# Patient Record
Sex: Female | Born: 1970 | Race: Black or African American | Hispanic: No | Marital: Single | Smoking: Former smoker
Health system: Southern US, Community
[De-identification: ages and names within clinical notes are randomized; demographics above are authoritative.]

## PROBLEM LIST (undated history)

## (undated) DIAGNOSIS — Z87442 Personal history of urinary calculi: Secondary | ICD-10-CM

## (undated) DIAGNOSIS — Z789 Other specified health status: Secondary | ICD-10-CM

## (undated) DIAGNOSIS — G935 Compression of brain: Secondary | ICD-10-CM

## (undated) HISTORY — PX: OTHER SURGICAL HISTORY: SHX169

## (undated) HISTORY — DX: Other specified health status: Z78.9

## (undated) HISTORY — PX: BRAIN SURGERY: SHX531

## (undated) HISTORY — DX: Compression of brain: G93.5

---

## 2002-10-19 DIAGNOSIS — G5601 Carpal tunnel syndrome, right upper limb: Secondary | ICD-10-CM

## 2002-10-19 HISTORY — DX: Carpal tunnel syndrome, right upper limb: G56.01

## 2002-10-19 HISTORY — PX: OTHER SURGICAL HISTORY: SHX169

## 2004-07-19 ENCOUNTER — Ambulatory Visit: Payer: Self-pay | Admitting: Obstetrics and Gynecology

## 2004-08-02 ENCOUNTER — Observation Stay: Payer: Self-pay

## 2004-08-08 ENCOUNTER — Observation Stay: Payer: Self-pay

## 2004-08-11 ENCOUNTER — Observation Stay: Payer: Self-pay | Admitting: Obstetrics and Gynecology

## 2004-08-26 ENCOUNTER — Ambulatory Visit: Payer: Self-pay | Admitting: Obstetrics and Gynecology

## 2004-08-30 ENCOUNTER — Observation Stay: Payer: Self-pay | Admitting: Obstetrics and Gynecology

## 2004-09-05 ENCOUNTER — Observation Stay: Payer: Self-pay

## 2004-09-15 ENCOUNTER — Observation Stay: Payer: Self-pay | Admitting: Obstetrics and Gynecology

## 2004-09-22 ENCOUNTER — Inpatient Hospital Stay: Payer: Self-pay | Admitting: Obstetrics and Gynecology

## 2004-09-25 ENCOUNTER — Ambulatory Visit: Payer: Self-pay | Admitting: Obstetrics and Gynecology

## 2004-10-28 ENCOUNTER — Ambulatory Visit: Payer: Self-pay | Admitting: Obstetrics and Gynecology

## 2004-11-19 ENCOUNTER — Ambulatory Visit: Payer: Self-pay | Admitting: Obstetrics and Gynecology

## 2004-12-23 ENCOUNTER — Emergency Department: Payer: Self-pay | Admitting: Unknown Physician Specialty

## 2005-04-03 ENCOUNTER — Emergency Department: Payer: Self-pay | Admitting: Unknown Physician Specialty

## 2008-03-26 ENCOUNTER — Emergency Department: Payer: Self-pay | Admitting: Emergency Medicine

## 2011-08-19 ENCOUNTER — Ambulatory Visit: Payer: Self-pay | Admitting: Obstetrics and Gynecology

## 2011-12-30 ENCOUNTER — Ambulatory Visit: Payer: Self-pay | Admitting: Internal Medicine

## 2013-03-21 ENCOUNTER — Ambulatory Visit: Payer: Self-pay | Admitting: Obstetrics and Gynecology

## 2013-03-23 HISTORY — PX: COLPOSCOPY: SHX161

## 2014-03-22 ENCOUNTER — Ambulatory Visit: Payer: Self-pay | Admitting: Internal Medicine

## 2015-06-17 ENCOUNTER — Other Ambulatory Visit: Payer: Self-pay | Admitting: Internal Medicine

## 2015-06-17 ENCOUNTER — Other Ambulatory Visit: Payer: Self-pay | Admitting: Obstetrics and Gynecology

## 2015-06-17 DIAGNOSIS — Z1231 Encounter for screening mammogram for malignant neoplasm of breast: Secondary | ICD-10-CM

## 2015-06-21 ENCOUNTER — Ambulatory Visit: Payer: Self-pay

## 2015-06-21 ENCOUNTER — Ambulatory Visit
Admission: RE | Admit: 2015-06-21 | Discharge: 2015-06-21 | Disposition: A | Discharge status: Home / Self Care | Payer: 59 | Source: Ambulatory Visit | Attending: Internal Medicine | Admitting: Internal Medicine

## 2015-06-21 DIAGNOSIS — Z1231 Encounter for screening mammogram for malignant neoplasm of breast: Secondary | ICD-10-CM | POA: Diagnosis not present

## 2016-01-19 ENCOUNTER — Emergency Department: Payer: Self-pay

## 2016-01-19 ENCOUNTER — Encounter: Payer: Self-pay | Admitting: Emergency Medicine

## 2016-01-19 ENCOUNTER — Emergency Department
Admission: EM | Admit: 2016-01-19 | Discharge: 2016-01-19 | Disposition: A | Discharge status: Home / Self Care | Payer: Self-pay | Attending: Student | Admitting: Student

## 2016-01-19 DIAGNOSIS — S91332A Puncture wound without foreign body, left foot, initial encounter: Secondary | ICD-10-CM | POA: Insufficient documentation

## 2016-01-19 DIAGNOSIS — W268XXA Contact with other sharp object(s), not elsewhere classified, initial encounter: Secondary | ICD-10-CM | POA: Insufficient documentation

## 2016-01-19 DIAGNOSIS — F17200 Nicotine dependence, unspecified, uncomplicated: Secondary | ICD-10-CM | POA: Insufficient documentation

## 2016-01-19 DIAGNOSIS — Z23 Encounter for immunization: Secondary | ICD-10-CM | POA: Insufficient documentation

## 2016-01-19 DIAGNOSIS — Y999 Unspecified external cause status: Secondary | ICD-10-CM | POA: Insufficient documentation

## 2016-01-19 DIAGNOSIS — F17210 Nicotine dependence, cigarettes, uncomplicated: Secondary | ICD-10-CM | POA: Insufficient documentation

## 2016-01-19 DIAGNOSIS — Y929 Unspecified place or not applicable: Secondary | ICD-10-CM | POA: Insufficient documentation

## 2016-01-19 DIAGNOSIS — Y939 Activity, unspecified: Secondary | ICD-10-CM | POA: Insufficient documentation

## 2016-01-19 MED ORDER — NAPROXEN 500 MG PO TABS: 500.0000 mg | ORAL_TABLET | Freq: Two times a day (BID) | ORAL | Status: DC

## 2016-01-19 MED ORDER — TETANUS-DIPHTH-ACELL PERTUSSIS 5-2.5-18.5 LF-MCG/0.5 IM SUSP
0.5000 mL | Freq: Once | INTRAMUSCULAR | Status: AC
  Administered 2016-01-19: 0.5 mL via INTRAMUSCULAR
  Filled 2016-01-19: qty 0.5

## 2016-01-19 MED ORDER — AMOXICILLIN-POT CLAVULANATE 875-125 MG PO TABS: 1.0000 | ORAL_TABLET | Freq: Two times a day (BID) | ORAL | Status: DC

## 2016-01-19 NOTE — ED Notes (Signed)
NAD noted at time of D/C. Pt denies questions or concerns. Pt ambulatory to the lobby at this time.  

## 2016-01-19 NOTE — Discharge Instructions (Signed)
Puncture Wound A puncture wound is an injury that is caused by a sharp, thin object that goes through (penetrates) your skin. Usually, a puncture wound does not leave a large opening in your skin, so it may not bleed a lot. However, when you get a puncture wound, dirt or other materials (foreign bodies) can be forced into your wound and break off inside. This increases the chance of infection, such as tetanus. CAUSES Puncture wounds are caused by any sharp, thin object that goes through your skin, such as:  Animal teeth, as with an animal bite.  Sharp, pointed objects, such as nails, splinters of glass, fishhooks, and needles. SYMPTOMS Symptoms of a puncture wound include:  Pain.  Bleeding.  Swelling.  Bruising.  Fluid leaking from the wound.  Numbness, tingling, or loss of function. DIAGNOSIS This condition is diagnosed with a medical history and physical exam. Your wound will be checked to see if it contains any foreign bodies. You may also have X-rays or other imaging tests. TREATMENT Treatment for a puncture wound depends on how serious the wound is. It also depends on whether the wound contains any foreign bodies. Treatment for all types of puncture wounds usually starts with:  Controlling the bleeding.  Washing out the wound with a germ-free (sterile) salt-water solution.  Checking the wound for foreign bodies. Treatment may also include:  Having the wound opened surgically to remove a foreign object.  Closing the wound with stitches (sutures) if it continues to bleed.  Covering the wound with antibiotic ointments and a bandage (dressing).  Receiving a tetanus shot.  Receiving a rabies vaccine. HOME CARE INSTRUCTIONS Medicines  Take or apply over-the-counter and prescription medicines only as told by your health care provider.  If you were prescribed an antibiotic, take or apply it as told by your health care provider. Do not stop using the antibiotic even if  your condition improves. Wound Care  There are many ways to close and cover a wound. For example, a wound can be covered with sutures, skin glue, or adhesive strips. Follow instructions from your health care provider about:  How to take care of your wound.  When and how you should change your dressing.  When you should remove your dressing.  Removing whatever was used to close your wound.  Keep the dressing dry as told by your health care provider. Do not take baths, swim, use a hot tub, or do anything that would put your wound underwater until your health care provider approves.  Clean the wound as told by your health care provider.  Do not scratch or pick at the wound.  Check your wound every day for signs of infection. Watch for:  Redness, swelling, or pain.  Fluid, blood, or pus. General Instructions  Raise (elevate) the injured area above the level of your heart while you are sitting or lying down.  If your puncture wound is in your foot, ask your health care provider if you need to avoid putting weight on your foot and for how long.  Keep all follow-up visits as told by your health care provider. This is important. SEEK MEDICAL CARE IF:  You received a tetanus shot and you have swelling, severe pain, redness, or bleeding at the injection site.  You have a fever.  Your sutures come out.  You notice a bad smell coming from your wound or your dressing.  You notice something coming out of your wound, such as wood or glass.  Your   pain is not controlled with medicine.  You have increased redness, swelling, or pain at the site of your wound.  You have fluid, blood, or pus coming from your wound.  You notice a change in the color of your skin near your wound.  You need to change the dressing frequently due to fluid, blood, or pus draining from your wound.  You develop a new rash.  You develop numbness around your wound. SEEK IMMEDIATE MEDICAL CARE IF:  You  develop severe swelling around your wound.  Your pain suddenly increases and is severe.  You develop painful skin lumps.  You have a red streak going away from your wound.  The wound is on your hand or foot and you cannot properly move a finger or toe.  The wound is on your hand or foot and you notice that your fingers or toes look pale or bluish.   This information is not intended to replace advice given to you by your health care provider. Make sure you discuss any questions you have with your health care provider.   Document Released: 07/15/2005 Document Revised: 06/26/2015 Document Reviewed: 11/28/2014 Elsevier Interactive Patient Education 2016 [place].   Please soak foot and half warm water, half hydrogen peroxide. Take antibiotic as prescribed. Naproxen as needed for pain. Return to the clinic emergency department for any warmth erythema or drainage or increased pain.

## 2016-01-19 NOTE — ED Provider Notes (Signed)
CSN: [id number]     Arrival date & time 01/19/16  1040 History   First MD Initiated Contact with Patient 01/19/16 1053     Chief Complaint  Patient presents with  . Foot Pain     (Consider location/radiation/quality/duration/timing/severity/associated sxs/prior Treatment) HPI  45 year old female presents to emergency department for evaluation of left foot puncture wound. She stepped on a piece of metal last night. She was wearing flip-flops, the piece of metal did not go through the sole of her shoe. She has a puncture wound along the lateral aspect of the left foot at the base of the fifth metatarsal. Pain is 7 out of 10 with walking. She has not taken any medications for pain. Tetanus is not up-to-date. She describes the pain as sharp.  History reviewed. No pertinent past medical history. Past Surgical History  Procedure Laterality Date  . Carpel tunnel release     History reviewed. No pertinent family history. Social History  Substance Use Topics  . Smoking status: Current Some Day Smoker  . Smokeless tobacco: None  . Alcohol Use: Yes   OB History    No data available     Review of Systems  Constitutional: Negative for fever, chills, activity change and fatigue.  HENT: Negative for congestion, sinus pressure and sore throat.   Eyes: Negative for visual disturbance.  Respiratory: Negative for cough, chest tightness and shortness of breath.   Cardiovascular: Negative for chest pain and leg swelling.  Gastrointestinal: Negative for nausea, vomiting, abdominal pain and diarrhea.  Genitourinary: Negative for dysuria.  Musculoskeletal: Negative for arthralgias and gait problem.  Skin: Positive for wound. Negative for rash.  Neurological: Negative for weakness, numbness and headaches.  Hematological: Negative for adenopathy.  Psychiatric/Behavioral: Negative for behavioral problems, confusion and agitation.      Allergies  Review of patient's allergies indicates no known  allergies.  Home Medications   Prior to Admission medications   Medication Sig Start Date End Date Taking? Authorizing Provider  amoxicillin-clavulanate (AUGMENTIN) 875-125 MG tablet Take 1 tablet by mouth every 12 (twelve) hours. 7 days 01/19/16   Duanne Guess, PA-C  naproxen (NAPROSYN) 500 MG tablet Take 1 tablet (500 mg total) by mouth 2 (two) times daily with a meal. 01/19/16   Duanne Guess, PA-C   BP 118/73 mmHg  Pulse 83  Temp(Src) 98.7 F (37.1 C) (Oral)  Resp 18  Ht 5\' 5"  (1.651 m)  Wt 86.183 kg  BMI 31.62 kg/m2  SpO2 99% Physical Exam  Constitutional: She is oriented to person, place, and time. She appears well-developed and well-nourished. No distress.  HENT:  Head: Normocephalic and atraumatic.  Mouth/Throat: Oropharynx is clear and moist.  Eyes: EOM are normal. Pupils are equal, round, and reactive to light. Right eye exhibits no discharge. Left eye exhibits no discharge.  Neck: Normal range of motion. Neck supple.  Cardiovascular: Normal rate, regular rhythm and intact distal pulses.   Pulmonary/Chest: Effort normal and breath sounds normal. No respiratory distress. She exhibits no tenderness.  Musculoskeletal: Normal range of motion. She exhibits no edema.  Examination of the left ankle and foot shows patient is tender to palpation only at the base of the fifth metatarsal where there is a small puncture wound with blood blister present. There is no sign of foreign body. There is no active bleeding. Wound margins are well aligned. She has full ankle plantarflexion and dorsiflexion. She is nontender along the medial or lateral malleolus. 2+ dorsalis pedis pulse. No swelling  warmth or edema.  Neurological: She is alert and oriented to person, place, and time. She has normal reflexes.  Skin: Skin is warm and dry.  Psychiatric: She has a normal mood and affect. Her behavior is normal. Thought content normal.    ED Course  Procedures (including critical care time) Labs  Review Labs Reviewed - No data to display  Imaging Review Dg Foot Complete Left  01/19/2016  CLINICAL DATA:  Stepped on a piece of metal last night with LEFT foot, entrance wound at plantar aspect base of fifth metatarsal question retained foreign body, initial encounter EXAM: LEFT FOOT - COMPLETE 3+ VIEW COMPARISON:  None FINDINGS: Osseous mineralization normal. Joint spaces preserved. No acute fracture, dislocation, or bone destruction. No radiopaque foreign body or soft tissue gas identified. IMPRESSION: Normal exam. Electronically Signed   By: Lavonia Dana M.D.   On: 01/19/2016 11:26   I have personally reviewed and evaluated these images and lab results as part of my medical decision-making.   EKG Interpretation None      MDM   Final diagnoses:  Puncture wound of foot, left, initial encounter    45 year old female with puncture wound to the left foot, did not puncture the sole of her shoe. She will soak daily and half water half peroxide. Take Augmentin twice a day 7 days. Tetanus is updated in the emergency department. Educated on signs and symptoms to return to the ED for. Naproxen as needed for pain.    Duanne Guess, PA-C 01/19/16 1150  Joanne Gavel, MD 01/19/16 [phone]

## 2016-01-19 NOTE — ED Notes (Signed)
Pt stepped on piece of metal last night with left foot.  Thinks piece may still be in foot. Ambulatory to triage.  C/o pain to left foot.

## 2016-03-02 ENCOUNTER — Encounter: Payer: Self-pay | Admitting: Emergency Medicine

## 2016-03-02 ENCOUNTER — Emergency Department
Admission: EM | Admit: 2016-03-02 | Discharge: 2016-03-02 | Disposition: A | Discharge status: Home / Self Care | Payer: 59 | Attending: Emergency Medicine | Admitting: Emergency Medicine

## 2016-03-02 DIAGNOSIS — B029 Zoster without complications: Secondary | ICD-10-CM

## 2016-03-02 DIAGNOSIS — F17200 Nicotine dependence, unspecified, uncomplicated: Secondary | ICD-10-CM | POA: Insufficient documentation

## 2016-03-02 LAB — URINALYSIS COMPLETE WITH MICROSCOPIC (ARMC ONLY)
Bilirubin Urine: NEGATIVE
GLUCOSE, UA: NEGATIVE mg/dL
Hgb urine dipstick: NEGATIVE
Ketones, ur: NEGATIVE mg/dL
Nitrite: NEGATIVE
Protein, ur: NEGATIVE mg/dL
Specific Gravity, Urine: 1.012 (ref 1.005–1.030)
pH: 5 (ref 5.0–8.0)

## 2016-03-02 LAB — CBC
HCT: 39.7 % (ref 35.0–47.0)
Hemoglobin: 13.8 g/dL (ref 12.0–16.0)
MCH: 32.2 pg (ref 26.0–34.0)
MCHC: 34.7 g/dL (ref 32.0–36.0)
MCV: 92.7 fL (ref 80.0–100.0)
PLATELETS: 281 10*3/uL (ref 150–440)
RBC: 4.28 MIL/uL (ref 3.80–5.20)
RDW: 13.7 % (ref 11.5–14.5)
WBC: 5 10*3/uL (ref 3.6–11.0)

## 2016-03-02 LAB — LIPASE, BLOOD: LIPASE: 28 U/L (ref 11–51)

## 2016-03-02 LAB — COMPREHENSIVE METABOLIC PANEL
ALT: 14 U/L (ref 14–54)
ANION GAP: 6 (ref 5–15)
AST: 16 U/L (ref 15–41)
Albumin: 4.2 g/dL (ref 3.5–5.0)
Alkaline Phosphatase: 29 U/L — ABNORMAL LOW (ref 38–126)
BUN: 15 mg/dL (ref 6–20)
CHLORIDE: 107 mmol/L (ref 101–111)
CO2: 26 mmol/L (ref 22–32)
Calcium: 8.7 mg/dL — ABNORMAL LOW (ref 8.9–10.3)
Creatinine, Ser: 0.71 mg/dL (ref 0.44–1.00)
GFR calc non Af Amer: >60 mL/min (ref >60)
Glucose, Bld: 90 mg/dL (ref 65–99)
POTASSIUM: 4 mmol/L (ref 3.5–5.1)
SODIUM: 139 mmol/L (ref 135–145)
Total Bilirubin: 1.4 mg/dL — ABNORMAL HIGH (ref 0.3–1.2)
Total Protein: 7.2 g/dL (ref 6.5–8.1)

## 2016-03-02 LAB — POCT PREGNANCY, URINE: PREG TEST UR: NEGATIVE

## 2016-03-02 MED ORDER — PREDNISONE 10 MG (21) PO TBPK: ORAL_TABLET | ORAL | Status: DC

## 2016-03-02 MED ORDER — TRAMADOL HCL 50 MG PO TABS: 50.0000 mg | ORAL_TABLET | Freq: Four times a day (QID) | ORAL | Status: AC | PRN

## 2016-03-02 MED ORDER — VALACYCLOVIR HCL 1 G PO TABS: 1000.0000 mg | ORAL_TABLET | Freq: Two times a day (BID) | ORAL | Status: DC

## 2016-03-02 NOTE — ED Notes (Addendum)
SAYS RASH ON RIGHT LOWER GROIN.  BURNING AND SOMETIMES ITCHING.  SAYS IT GOES AROUND TO RIGHT LOW BACK. PAINFUL.  SAYS RIGHT L0OWER QUAD PAIN.

## 2016-03-02 NOTE — ED Provider Notes (Addendum)
[facility] Emergency Department Provider Note        Time seen: ----------------------------------------- 11:23 AM on 03/02/2016 -----------------------------------------    I have reviewed the triage vital signs and the nursing notes.   HISTORY  Chief Complaint Rash    HPI Dominique Estes is a 45 y.o. female who presents to ER with a rash to her right groin area. Patient said burning and sometimes itching. She states the burning started around Friday and then subsequently developed a rash. Patient states the pain goes around to her right lower back. She denies history of this before, nothing makes it better or worse. She denies any recent illness.   History reviewed. No pertinent past medical history.  There are no active problems to display for this patient.   Past Surgical History  Procedure Laterality Date  . Carpel tunnel release      Allergies Review of patient's allergies indicates no known allergies.  Social History Social History  Substance Use Topics  . Smoking status: Current Some Day Smoker  . Smokeless tobacco: None  . Alcohol Use: Yes    Review of Systems Constitutional: Negative for fever. Eyes: Negative for visual changes. ENT: Negative for sore throat. Cardiovascular: Negative for chest pain. Respiratory: Negative for shortness of breath. Gastrointestinal: Negative for abdominal pain, vomiting and diarrhea. Genitourinary: Negative for dysuria. Musculoskeletal: Positive for right-sided low back pain Skin:Positive for rash Neurological: Negative for headaches, focal weakness or numbness.  10-point ROS otherwise negative.  ____________________________________________   PHYSICAL EXAM:  VITAL SIGNS: ED Triage Vitals  Enc Vitals Group     BP 03/02/16 0943 119/76 mmHg     Pulse Rate 03/02/16 0943 76     Resp --      Temp 03/02/16 0943 98.6 F (37 C)     Temp src --      SpO2 03/02/16 0943 97 %     Weight --       Height --      Head Cir --      Peak Flow --      Pain Score 03/02/16 0944 9     Pain Loc --      Pain Edu? --      Excl. in [place]? --     Constitutional: Alert and oriented. Well appearing and in no distress. Eyes: Conjunctivae are normal. PERRL. Normal extraocular movements. Musculoskeletal: Nontender with normal range of motion in all extremities. Small right inguinal lymphadenopathy Neurologic:  Normal speech and language. No gross focal neurologic deficits are appreciated.  Skin:  Group vesicular rash noted in the right inguinal area, L1 distribution Psychiatric: Mood and affect are normal. Speech and behavior are normal.   ____________________________________________  ED COURSE:  Pertinent labs & imaging results that were available during my care of the patient were reviewed by me and considered in my medical decision making (see chart for details). Patient presents clinically with shingles. Labs were ordered prior to my evaluation which we will reassess. ____________________________________________    LABS (pertinent positives/negatives)  Labs Reviewed  COMPREHENSIVE METABOLIC PANEL - Abnormal; Notable for the following:    Calcium 8.7 (*)    Alkaline Phosphatase 29 (*)    Total Bilirubin 1.4 (*)    All other components within normal limits  URINALYSIS COMPLETEWITH MICROSCOPIC ([facility] ONLY) - Abnormal; Notable for the following:    Color, Urine YELLOW (*)    APPearance CLOUDY (*)    Leukocytes, UA TRACE (*)    Bacteria,  UA RARE (*)    Squamous Epithelial / LPF TOO NUMEROUS TO COUNT (*)    All other components within normal limits  LIPASE, BLOOD  CBC  POC URINE PREG, ED  POCT PREGNANCY, URINE   ____________________________________________  FINAL ASSESSMENT AND PLAN  Shingles  Plan: Patient with labs as dictated above. Patient resents for shingles, will be started on antiviral medicines and a steroid taper. She is stable for outpatient follow-up with her  doctor   Earleen Newport, MD   Note: This dictation was prepared with Dragon dictation. Any transcriptional errors that result from this process are unintentional   Earleen Newport, MD 03/02/16 [place], MD 03/02/16 1131

## 2016-03-02 NOTE — Discharge Instructions (Signed)

## 2016-03-02 NOTE — ED Notes (Signed)
EDP at bedside  

## 2018-05-18 ENCOUNTER — Emergency Department
Admission: EM | Admit: 2018-05-18 | Discharge: 2018-05-18 | Disposition: A | Discharge status: Home / Self Care | Payer: 59 | Attending: Emergency Medicine | Admitting: Emergency Medicine

## 2018-05-18 ENCOUNTER — Other Ambulatory Visit: Payer: Self-pay

## 2018-05-18 ENCOUNTER — Emergency Department: Payer: Self-pay

## 2018-05-18 DIAGNOSIS — Z79899 Other long term (current) drug therapy: Secondary | ICD-10-CM | POA: Insufficient documentation

## 2018-05-18 DIAGNOSIS — F17210 Nicotine dependence, cigarettes, uncomplicated: Secondary | ICD-10-CM | POA: Insufficient documentation

## 2018-05-18 DIAGNOSIS — G510 Bell's palsy: Secondary | ICD-10-CM | POA: Insufficient documentation

## 2018-05-18 LAB — COMPREHENSIVE METABOLIC PANEL
ALT: 18 U/L (ref 0–44)
ANION GAP: 8 (ref 5–15)
AST: 22 U/L (ref 15–41)
Albumin: 4 g/dL (ref 3.5–5.0)
Alkaline Phosphatase: 33 U/L — ABNORMAL LOW (ref 38–126)
BUN: 9 mg/dL (ref 6–20)
CALCIUM: 8.7 mg/dL — AB (ref 8.9–10.3)
CHLORIDE: 107 mmol/L (ref 98–111)
CO2: 27 mmol/L (ref 22–32)
CREATININE: 0.73 mg/dL (ref 0.44–1.00)
GFR calc Af Amer: >60 mL/min (ref >60)
Glucose, Bld: 97 mg/dL (ref 70–99)
Potassium: 3.7 mmol/L (ref 3.5–5.1)
SODIUM: 142 mmol/L (ref 135–145)
Total Bilirubin: 1.2 mg/dL (ref 0.3–1.2)
Total Protein: 7.1 g/dL (ref 6.5–8.1)

## 2018-05-18 LAB — DIFFERENTIAL
BASOS PCT: 1 %
Basophils Absolute: 0.1 10*3/uL (ref 0–0.1)
EOS ABS: 0.2 10*3/uL (ref 0–0.7)
EOS PCT: 3 %
Lymphocytes Relative: 47 %
Lymphs Abs: 2.3 10*3/uL (ref 1.0–3.6)
MONO ABS: 0.5 10*3/uL (ref 0.2–0.9)
Monocytes Relative: 11 %
Neutro Abs: 1.8 10*3/uL (ref 1.4–6.5)
Neutrophils Relative %: 38 %

## 2018-05-18 LAB — CBC
HCT: 38.5 % (ref 35.0–47.0)
Hemoglobin: 13.2 g/dL (ref 12.0–16.0)
MCH: 32.2 pg (ref 26.0–34.0)
MCHC: 34.4 g/dL (ref 32.0–36.0)
MCV: 93.5 fL (ref 80.0–100.0)
PLATELETS: 340 10*3/uL (ref 150–440)
RBC: 4.12 MIL/uL (ref 3.80–5.20)
RDW: 12.7 % (ref 11.5–14.5)
WBC: 4.9 10*3/uL (ref 3.6–11.0)

## 2018-05-18 LAB — GLUCOSE, CAPILLARY: GLUCOSE-CAPILLARY: 108 mg/dL — AB (ref 70–99)

## 2018-05-18 LAB — TROPONIN I: Troponin I: <0.03 ng/mL (ref <0.03)

## 2018-05-18 LAB — APTT: aPTT: 27 seconds (ref 24–36)

## 2018-05-18 LAB — PROTIME-INR
INR: 1.01
Prothrombin Time: 13.2 seconds (ref 11.4–15.2)

## 2018-05-18 MED ORDER — ARTIFICIAL TEARS OPHTHALMIC OINT: TOPICAL_OINTMENT | OPHTHALMIC | 1 refills | Status: DC | PRN

## 2018-05-18 MED ORDER — PREDNISONE 10 MG PO TABS: 60.0000 mg | ORAL_TABLET | Freq: Every day | ORAL | 0 refills | Status: DC

## 2018-05-18 MED ORDER — VALACYCLOVIR HCL 500 MG PO TABS: 1000.0000 mg | ORAL_TABLET | Freq: Every day | ORAL | 0 refills | Status: AC

## 2018-05-18 NOTE — ED Provider Notes (Signed)
[facility] Emergency Department Provider Note  ____________________________________________   I have reviewed the triage vital signs and the nursing notes. Where available I have reviewed prior notes and, if possible and indicated, outside hospital notes.    HISTORY  Chief Complaint Numbness    HPI Dominique Estes is a 47 y.o. female who is healthy, no recent history of rashes or viral syndrome, presents today with about 24 hours of left-sided facial tingling and weakness.  It does involve the forehead.  Never had this before.  No recent tick bite.  [place] no other neurologic complaints.  Nothing makes it better nothing makes it worse no antecedent treatment.   History reviewed. No pertinent past medical history.  There are no active problems to display for this patient.   Past Surgical History:  Procedure Laterality Date  . carpel tunnel release      Prior to Admission medications   Medication Sig Start Date End Date Taking? Authorizing Provider  amoxicillin-clavulanate (AUGMENTIN) 875-125 MG tablet Take 1 tablet by mouth every 12 (twelve) hours. 7 days 01/19/16   Duanne Guess, PA-C  naproxen (NAPROSYN) 500 MG tablet Take 1 tablet (500 mg total) by mouth 2 (two) times daily with a meal. 01/19/16   Duanne Guess, PA-C  predniSONE (STERAPRED UNI-PAK 21 TAB) 10 MG (21) TBPK tablet Dispense steroid taper pack as directed 03/02/16   Earleen Newport, MD  valACYclovir (VALTREX) 1000 MG tablet Take 1 tablet (1,000 mg total) by mouth 2 (two) times daily. 03/02/16   Earleen Newport, MD    Allergies Patient has no known allergies.  History reviewed. No pertinent family history.  Social History Social History   Tobacco Use  . Smoking status: Current Some Day Smoker  Substance Use Topics  . Alcohol use: Yes  . Drug use: No    Review of Systems Constitutional: No fever/chills Eyes: No visual changes. ENT: No sore throat. No stiff neck no  neck pain Cardiovascular: Denies chest pain. Respiratory: Denies shortness of breath. Gastrointestinal:   no vomiting.  No diarrhea.  No constipation. Genitourinary: Negative for dysuria. Musculoskeletal: Negative lower extremity swelling Skin: Negative for rash. Neurological: Negative for severe headaches, see HPI  ____________________________________________   PHYSICAL EXAM:  VITAL SIGNS: ED Triage Vitals  Enc Vitals Group     BP 05/18/18 1453 121/82     Pulse Rate 05/18/18 1453 78     Resp 05/18/18 1453 16     Temp 05/18/18 1453 98.5 F (36.9 C)     Temp Source 05/18/18 1453 Oral     SpO2 05/18/18 1453 100 %     Weight 05/18/18 1445 240 lb (108.9 kg)     Height 05/18/18 1445 5\' 5"  (1.651 m)     Head Circumference --      Peak Flow --      Pain Score 05/18/18 1445 5     Pain Loc --      Pain Edu? --      Excl. in [place]? --     Constitutional: Alert and oriented. Well appearing and in no acute distress. Eyes: Conjunctivae are normal Head: Atraumatic HEENT: No congestion/rhinnorhea. Mucous membranes are moist.  Oropharynx non-erythematous, no oral lesions, no herpetic lesions seen in the ear or nose, or anywhere else for that matter, normal TMs, Neck:   Nontender with no meningismus, no masses, no stridor Cardiovascular: Normal rate, regular rhythm. Grossly normal heart sounds.  Good peripheral circulation. Respiratory: Normal respiratory  effort.  No retractions. Lungs CTAB. Abdominal: Soft and nontender. No distention. No guarding no rebound Back:  There is no focal tenderness or step off.  there is no midline tenderness there are no lesions noted. Musculoskeletal: No lower extremity tenderness, no upper extremity tenderness. No joint effusions, no DVT signs strong distal pulses no edema Neurologic: \cranial nerves II through XII are grossly intact, with the exception of the seventh over the left, which involves his forehead.  There is forehead involved decreased strength on  that side and subjective tingling although she can feel he feels less significant.  There is also the ability to close the eye nearly completely  5 out of 5 strength bilateral upper and lower extremity. Finger to nose within normal limits heel to shin within normal limits, speech is normal with no word finding difficulty or dysarthria, reflexes symmetric, pupils are equally round and reactive to light, there is no pronator drift, sensation is normal, vision is intact to confrontation, gait is deferred, there is no nystagmus, normal neurologic exam Skin:  Skin is warm, dry and intact. No rash noted. Psychiatric: Mood and affect are normal. Speech and behavior are normal.  ____________________________________________   LABS (all labs ordered are listed, but only abnormal results are displayed)  Labs Reviewed  COMPREHENSIVE METABOLIC PANEL - Abnormal; Notable for the following components:      Result Value   Calcium 8.7 (*)    Alkaline Phosphatase 33 (*)    All other components within normal limits  GLUCOSE, CAPILLARY - Abnormal; Notable for the following components:   Glucose-Capillary 108 (*)    All other components within normal limits  PROTIME-INR  APTT  CBC  DIFFERENTIAL  TROPONIN I  CBG MONITORING, ED    Pertinent labs  results that were available during my care of the patient were reviewed by me and considered in my medical decision making (see chart for details). ____________________________________________  EKG  I personally interpreted any EKGs ordered by me or triage Normal sinus rhythm rate 70 bpm, no acute ST elevation or depression normal axis unremarkable EKG borderline right bundle blanch block ____________________________________________  RADIOLOGY  Pertinent labs & imaging results that were available during my care of the patient were reviewed by me and considered in my medical decision making (see chart for details). If possible, patient and/or family made aware  of any abnormal findings.  Ct Head Wo Contrast  Result Date: 05/18/2018 CLINICAL DATA:  Left-sided facial numbness. EXAM: CT HEAD WITHOUT CONTRAST TECHNIQUE: Contiguous axial images were obtained from the base of the skull through the vertex without intravenous contrast. COMPARISON:  12/30/2011 FINDINGS: Brain: There is no evidence for acute hemorrhage, hydrocephalus, mass lesion, or abnormal extra-axial fluid collection. No definite CT evidence for acute infarction. Vascular: No hyperdense vessel or unexpected calcification. Skull: Tiny intradural calcification left middle cranial fossa is unchanged in the 6 year interval since prior study. No evidence for fracture. No worrisome lytic or sclerotic lesion. Sinuses/Orbits: The visualized paranasal sinuses and mastoid air cells are clear. Visualized portions of the globes and intraorbital fat are unremarkable. Other: None. IMPRESSION: 1. Unremarkable CT evaluation of the brain with no findings to explain the patient's history of left facial numbness. Electronically Signed   By: Misty Stanley M.D.   On: 05/18/2018 16:23   ____________________________________________    PROCEDURES  Procedure(s) performed: None  Procedures  Critical Care performed: None  ____________________________________________   INITIAL IMPRESSION / ASSESSMENT AND PLAN / ED COURSE  Pertinent labs &  imaging results that were available during my care of the patient were reviewed by me and considered in my medical decision making (see chart for details).  Patient with signs and symptoms of acute Bell's palsy, she is within 24 hours of onset we will start her on steroids, we will also give her acyclovir as a precaution although they did that are equivocal about it I will also give her artificial tears have given her instructions on how to protect her eye and we will discharge with close neurologic follow-up.  Low suspicion for central stroke which sometimes can cause the same  symptoms, this very much seems like a Bell's palsy.    ____________________________________________   FINAL CLINICAL IMPRESSION(S) / ED DIAGNOSES  Final diagnoses:  None      This chart was dictated using voice recognition software.  Despite best efforts to proofread,  errors can occur which can change meaning.      Schuyler Amor, MD 05/18/18 [phone]

## 2018-05-18 NOTE — ED Notes (Signed)
Pt alert and oriented X4, active, cooperative, pt in NAD. RR even and unlabored, color WNL.  Pt informed to return if any life threatening symptoms occur.  Discharge and followup instructions reviewed. Ambulates safely. 

## 2018-05-18 NOTE — ED Triage Notes (Signed)
Pt arrives with numbness to L side of face since yesterday. Alert, oriented, speaking in complete sentences. No facial droop noted but L sided paralysis appears when pt tries to move L side of face. No arm or leg drift. Denies any other area of body numb. Denies blood thinners.

## 2018-05-18 NOTE — ED Notes (Signed)
Decreased sensation to left side of face that began yesterday, no hx of such. When patient lifts eyebrows, only right brow moves up, left sided facial paralysis with smiling. Speech clear.

## 2018-12-28 ENCOUNTER — Other Ambulatory Visit: Payer: Self-pay | Admitting: Physician Assistant

## 2018-12-28 DIAGNOSIS — Z1231 Encounter for screening mammogram for malignant neoplasm of breast: Secondary | ICD-10-CM

## 2019-01-04 ENCOUNTER — Other Ambulatory Visit: Payer: Self-pay

## 2019-01-04 ENCOUNTER — Ambulatory Visit
Admission: RE | Admit: 2019-01-04 | Discharge: 2019-01-04 | Disposition: A | Discharge status: Home / Self Care | Payer: Managed Care, Other (non HMO) | Source: Ambulatory Visit | Attending: Physician Assistant | Admitting: Physician Assistant

## 2019-01-04 DIAGNOSIS — Z1231 Encounter for screening mammogram for malignant neoplasm of breast: Secondary | ICD-10-CM | POA: Insufficient documentation

## 2019-08-11 ENCOUNTER — Telehealth: Payer: Self-pay | Admitting: Family Medicine

## 2019-08-11 NOTE — Telephone Encounter (Signed)
pls call me having problems with my IUD

## 2019-08-11 NOTE — Telephone Encounter (Signed)
Returned patient phone call. Patient states she is concerned that she needs her IUD changed because she has recently been having vaginal bleeding and passing clots. Patient is unsure if when last Mirena IUD was placed. RN offered to schedule IUD removal/reinsertion appt. Patient declined stating "I'll call my Ob/Gyn to find out when I had this one put in and call you back on Monday." Hal Morales, RN

## 2019-11-28 ENCOUNTER — Other Ambulatory Visit: Payer: Self-pay | Admitting: Internal Medicine

## 2019-11-28 DIAGNOSIS — Z1231 Encounter for screening mammogram for malignant neoplasm of breast: Secondary | ICD-10-CM

## 2019-12-26 ENCOUNTER — Telehealth: Payer: Self-pay | Admitting: Family Medicine

## 2020-01-05 ENCOUNTER — Ambulatory Visit
Admission: RE | Admit: 2020-01-05 | Discharge: 2020-01-05 | Disposition: A | Discharge status: Home / Self Care | Payer: Managed Care, Other (non HMO) | Source: Ambulatory Visit | Attending: Internal Medicine | Admitting: Internal Medicine

## 2020-01-05 DIAGNOSIS — Z1231 Encounter for screening mammogram for malignant neoplasm of breast: Secondary | ICD-10-CM | POA: Insufficient documentation

## 2020-01-09 ENCOUNTER — Other Ambulatory Visit: Payer: Self-pay | Admitting: Physician Assistant

## 2020-01-09 DIAGNOSIS — R1032 Left lower quadrant pain: Secondary | ICD-10-CM

## 2020-01-10 ENCOUNTER — Ambulatory Visit
Admission: RE | Admit: 2020-01-10 | Discharge: 2020-01-10 | Disposition: A | Discharge status: Home / Self Care | Payer: Managed Care, Other (non HMO) | Source: Ambulatory Visit | Attending: Physician Assistant | Admitting: Physician Assistant

## 2020-01-10 ENCOUNTER — Other Ambulatory Visit: Payer: Self-pay

## 2020-01-10 DIAGNOSIS — R1032 Left lower quadrant pain: Secondary | ICD-10-CM | POA: Insufficient documentation

## 2020-01-10 MED ORDER — IOHEXOL 300 MG/ML  SOLN
100.0000 mL | Freq: Once | INTRAMUSCULAR | Status: AC | PRN
  Administered 2020-01-10: 100 mL via INTRAVENOUS

## 2020-02-01 NOTE — Telephone Encounter (Signed)
TC not routed to RN for triage when taken in March.  Attempted TC to patient, somone answered the phone but wouldn't say anything. Aileen Fass, RN

## 2020-09-24 ENCOUNTER — Encounter: Payer: Self-pay | Admitting: Family Medicine

## 2020-09-24 ENCOUNTER — Ambulatory Visit (LOCAL_COMMUNITY_HEALTH_CENTER): Payer: Medicaid Other | Admitting: Family Medicine

## 2020-09-24 ENCOUNTER — Other Ambulatory Visit: Payer: Self-pay

## 2020-09-24 VITALS — Ht 65.5 in | Wt 254.2 lb

## 2020-09-24 DIAGNOSIS — Z30430 Encounter for insertion of intrauterine contraceptive device: Secondary | ICD-10-CM

## 2020-09-24 DIAGNOSIS — Z3009 Encounter for other general counseling and advice on contraception: Secondary | ICD-10-CM | POA: Diagnosis not present

## 2020-09-24 DIAGNOSIS — Z30432 Encounter for removal of intrauterine contraceptive device: Secondary | ICD-10-CM

## 2020-09-24 MED ORDER — LEVONORGESTREL 20 MCG/24HR IU IUD
1.0000 | INTRAUTERINE_SYSTEM | Freq: Once | INTRAUTERINE | Status: AC
  Administered 2020-09-24: 1 via INTRAUTERINE

## 2020-09-24 NOTE — Progress Notes (Signed)
Family Planning Visit- Repeat Yearly Visit  Subjective:  Dominique Estes is a 49 y.o. [phone]  being seen today for an well woman visit and to discuss family planning options.    She is currently using IUD Mirena for pregnancy prevention. Patient reports she does not if she or her partner wants a pregnancy in the next year. Patient  does not have a problem list on file.  Chief Complaint  Patient presents with  . Annual Exam  . Contraception    desires IUD removal and reinsertion - Mirena    Patient reports she is here for her IUD removal and insertion.  States that since IUD was inserted 5 years ago she has begun to have heavier clotting.  She had her mammogram 03/2020 that was normal.  She is being folled by her PCp annually and as needed.  Patient denies other concerns  See flowsheet for other program required questions.   Body mass index is 41.66 kg/m. - Patient is eligible for diabetes screening based on BMI and age >88?  yes HA1C ordered? No  Client is followed by her PCP  Patient reports 2 of partners in last year. Desires STI screening?  Yes   Has patient been screened once for HCV in the past?  No  No results found for: HCVAB  Does the patient have current of drug use, have a partner with drug use, and/or has been incarcerated since last result? No  If yes-- Screen for HCV through [facility]   Does the patient meet criteria for HBV testing? No  Criteria:  -Household, sexual or needle sharing contact with HBV -History of drug use -HIV positive -Those with known Hep C   Health Maintenance Due  Topic Date Due  . Hepatitis C Screening  Never done  . HIV Screening  Never done  . PAP SMEAR-Modifier  Never done  . INFLUENZA VACCINE  Never done    Review of Systems  Constitutional: Positive for weight loss.       Intentional weight of about 10 lbs.  All other systems reviewed and are negative.   The following portions of the patient's history were reviewed  and updated as appropriate: allergies, current medications, past family history, past medical history, past social history, past surgical history and problem list. Problem list updated.  Objective:   Vitals:   09/24/20 1330  Weight: 254 lb 3.2 oz (115.3 kg)  Height: 5' 5.5" (1.664 m)    Physical Exam Constitutional:      Appearance: She is obese.  Cardiovascular:     Rate and Rhythm: Normal rate.  Abdominal:     Hernia: There is no hernia in the left inguinal area or right inguinal area.  Genitourinary:    General: Normal vulva.     Exam position: Lithotomy position.     Labia:        Right: No rash, tenderness or lesion.        Left: No rash, tenderness or lesion.      Vagina: Normal. No vaginal discharge, erythema, tenderness, bleeding, lesions or prolapsed vaginal walls.     Cervix: Normal.     Uterus: Normal.      Adnexa: Right adnexa normal and left adnexa normal.     Rectum: Normal.  Musculoskeletal:     Cervical back: Neck supple.  Skin:    General: Skin is warm and dry.  Neurological:     Mental Status: She is alert and oriented to  person, place, and time.    Assessment and Plan:  Dominique Estes is a 49 y.o. female [phone] presenting to the [facility] for an yearly well woman exam/family planning visit  Contraception counseling: Reviewed all forms of birth control options in the tiered based approach. available including abstinence; over the counter/barrier methods; hormonal contraceptive medication including pill, patch, ring, injection,contraceptive implant, ECP; hormonal and nonhormonal IUDs; permanent sterilization options including vasectomy and the various tubal sterilization modalities. Risks, benefits, and typical effectiveness rates were reviewed.  Questions were answered.  Written information was also given to the patient to review.  Patient desires Mirena IUD, this was prescribed for patient. She will follow up in  1 year for  surveillance.  She was told to call with any further questions, or with any concerns about this method of contraception.  Emphasized use of condoms 100% of the time for STI prevention.  Patient was no an ECP candidate   1. Family planning  - Chlamydia/Gonorrhea [facility]  2. Encounter for IUD removal   IUD Removal  Patient identified, informed consent performed, consent signed.  Patient was in the dorsal lithotomy position, normal external genitalia was noted.  A speculum was placed in the patient's vagina, normal discharge was noted, no lesions. The cervix was visualized, no lesions, no abnormal discharge.  The strings of the IUD were grasped and pulled using ring forceps. The IUD was removed in its entirety.  Patient tolerated the procedure well.    Patient will use Mirena for contraception  Routine preventative health maintenance measures emphasized.  3. Encounter for IUD insertion  Patient presented to [facility] for IUD insertion. Her GC/CT screening was found to be up to date and using WHO criteria we can be reasonably certain she is not pregnant   See Flowsheet for IUD check list  IUD Insertion Procedure Note Patient identified, informed consent performed, consent signed.   Discussed risks of irregular bleeding, cramping, infection, malpositioning or misplacement of the IUD outside the uterus which may require further procedure such as laparoscopy. Time out was performed.    Speculum placed in the vagina.  Cervix visualized.  Cleaned with Betadine x 2.  Grasped anteriorly with a single tooth tenaculum.  Uterus sounded to 9 cm.  IUD placed per manufacturer's recommendations.  Strings trimmed to 3 cm. Tenaculum was removed, good hemostasis noted.  Patient tolerated procedure well.   Patient was given post-procedure instructions- both agency handout and verbally by provider.  She was advised to have backup contraception for one week.  Patient was also asked to check IUD strings  periodically or follow up in 4 weeks for IUD check. - levonorgestrel (MIRENA) 20 MCG/24HR IUD 1 each     Return in about 1 year (around 09/24/2021) for 1 month for IUD string check as needed.  No future appointments.  Hassell Done, FNP

## 2020-09-24 NOTE — Progress Notes (Signed)
Pt to clinic for physical and IUD removal and reinsertion. Pt states last pap was done at [facility].

## 2020-09-26 LAB — IGP, APTIMA HPV
HPV Aptima: NEGATIVE
PAP Smear Comment: 0

## 2020-10-29 ENCOUNTER — Telehealth: Payer: Self-pay | Admitting: Family Medicine

## 2020-10-29 NOTE — Telephone Encounter (Signed)
Consulted by RN re: patient situation.  Reviewed RN note and agree that it reflects our discussion and my recommendations.

## 2020-10-29 NOTE — Telephone Encounter (Signed)
Return call to pt at phone number provided. Pt reports that she is having some irregular menstrual bleeding that started 2 months prior to having her Mirena IUD removed and reinserted 09/24/2020. Pt reports she had a period the beginning of 10/2020 and it stopped but then started back up again 10/28/2020 and reports is heavy and having some blood clots that come out. Pt reports going through about 4 pads a day. Pt reports has cramps on and off that Tylenol helped with. Pt denies any further symptoms and not having any fever, dizziness or lightheadedness. Consulted with Antoine Primas, PA who reports pt could try some Ibuprofen 800mg  every 8 hours (three times a day) with food or milk for 5 to 7 days to see if that helps with bleeding but if not to let us know. Counseled pt per Antoine Primas, PA and pt states understanding of counseling and how to take Ibuprofen. Counseled pt that if she does develop any dizziness, lightheadedness, severe cramps, or if she goes through a pad in an hour to go to ER or Urgent care to be evaluated and pt states understanding. Pt states she will try these recommendations and will give [place] a call if symptoms aren't relieved or if she has any further questions or concerns. Pt with no other questions at this time.

## 2021-01-17 ENCOUNTER — Other Ambulatory Visit: Payer: Self-pay | Admitting: Internal Medicine

## 2021-01-17 DIAGNOSIS — Z1231 Encounter for screening mammogram for malignant neoplasm of breast: Secondary | ICD-10-CM

## 2021-01-31 ENCOUNTER — Other Ambulatory Visit: Payer: Self-pay | Admitting: Internal Medicine

## 2021-01-31 DIAGNOSIS — Z1231 Encounter for screening mammogram for malignant neoplasm of breast: Secondary | ICD-10-CM

## 2021-02-05 ENCOUNTER — Ambulatory Visit
Admission: RE | Admit: 2021-02-05 | Discharge: 2021-02-05 | Disposition: A | Discharge status: Home / Self Care | Payer: Self-pay | Source: Ambulatory Visit | Attending: Internal Medicine | Admitting: Internal Medicine

## 2021-02-05 ENCOUNTER — Other Ambulatory Visit: Payer: Self-pay

## 2021-02-05 DIAGNOSIS — Z1231 Encounter for screening mammogram for malignant neoplasm of breast: Secondary | ICD-10-CM | POA: Insufficient documentation

## 2021-04-09 ENCOUNTER — Other Ambulatory Visit: Payer: Self-pay

## 2021-04-09 ENCOUNTER — Ambulatory Visit: Payer: Managed Care, Other (non HMO)

## 2021-04-09 ENCOUNTER — Ambulatory Visit (LOCAL_COMMUNITY_HEALTH_CENTER): Payer: BC Managed Care – PPO | Admitting: Advanced Practice Midwife

## 2021-04-09 ENCOUNTER — Encounter: Payer: Self-pay | Admitting: Advanced Practice Midwife

## 2021-04-09 VITALS — BP 137/85 | Ht 65.0 in | Wt 255.8 lb

## 2021-04-09 DIAGNOSIS — N939 Abnormal uterine and vaginal bleeding, unspecified: Secondary | ICD-10-CM

## 2021-04-09 DIAGNOSIS — F17200 Nicotine dependence, unspecified, uncomplicated: Secondary | ICD-10-CM

## 2021-04-09 DIAGNOSIS — Z3009 Encounter for other general counseling and advice on contraception: Secondary | ICD-10-CM

## 2021-04-09 DIAGNOSIS — Z30431 Encounter for routine checking of intrauterine contraceptive device: Secondary | ICD-10-CM

## 2021-04-09 DIAGNOSIS — E669 Obesity, unspecified: Secondary | ICD-10-CM

## 2021-04-09 NOTE — Progress Notes (Signed)
Contraception/Family Planning VISIT ENCOUNTER NOTE  Subjective:   Dominique Estes is a 50 y.o. SBF smoker [phone] female here for reproductive life counseling.  Reports she does not want a pregnancy in the next year. Denies abnormal vaginal bleeding, discharge, pelvic pain, problems with intercourse or other gynecologic concerns.  Pt c/o bleeding with clots since this Mirena inserted 09/24/20. Has had 3 other Mirena's without any problems. Tried Ibuprofen TID from 09/2020-01/2021 without results. Last PE 01/16/21 at [facility]. Last pap 09/24/20 neg HPV neg. Smokign 1/2-1 ppd. Last MJ 11/2020.  Last ETOH yesterday (1 mixed drink) qoday. Has apt with [facility] on 04/24/21 to discuss getting hysterectomy/ablation   Gynecologic History Patient's last menstrual period was 04/06/2021 (exact date). Contraception: IUD  Health Maintenance Due  Topic Date Due   Pneumococcal Vaccine 65-63 Years old (1 - PCV) Never done   HIV Screening  Never done   Hepatitis C Screening  Never done   COLONOSCOPY (Pts 45-34yrs Insurance coverage will need to be confirmed)  Never done   COVID-19 Vaccine (3 - Booster for Moderna series) 11/30/2020     The following portions of the patient's history were reviewed and updated as appropriate: allergies, current medications, past family history, past medical history, past social history, past surgical history and problem list.  Review of Systems Pertinent items are noted in HPI.   Objective:  BP 137/85   Ht 5\' 5"  (1.651 m)   Wt 255 lb 12.8 oz (116 kg)   LMP 04/06/2021 (Exact Date)   BMI 42.57 kg/m  Gen: well appearing, NAD HEENT: no scleral icterus CV: RR Lung: Normal WOB Ext: warm well perfused   Assessment and Plan:   Contraception counseling: Reviewed all forms of birth control options in the tiered based approach. available including abstinence; over the counter/barrier methods; hormonal contraceptive medication including pill, patch, ring, injection,contraceptive implant,  ECP; hormonal and nonhormonal IUDs; permanent sterilization options including vasectomy and the various tubal sterilization modalities. Risks, benefits, and typical effectiveness rates were reviewed.  Questions were answered.  Written information was also given to the patient to review.  Patient desires hysterectomy, this was not prescribed for patient. She will follow up in  04/24/21 at [facility] for surveillance.  She was told to call with any further questions, or with any concerns about this method of contraception.  Emphasized use of condoms 100% of the time for STI prevention.  Patient was offered ECP. ECP was not accepted by the patient. ECP counseling was not given - see RN documentation  1. Obesity, unspecified classification, unspecified obesity type, unspecified whether serious comorbidity present   2. Family planning Has apt already with [facility] 04/24/21 to discuss hysterectomy/ablation to solve issues  3. Encounter for management of intrauterine contraceptive device (IUD), unspecified IUD management type Mirena in place since 09/24/20  4. Smoker 1/2-1 ppd Counseled via 5 A's to stop smoking    Please refer to After Visit Summary for other counseling recommendations.   No follow-ups on file.  Herbie Saxon, [place]

## 2021-04-09 NOTE — Progress Notes (Signed)
Here today for concerns with heavy vaginal bleeding with clots. Also complaining of pain that radiates down left leg all since having Mirena IUD placed in 09/2020. Last PE was 01/16/2021 at [facility]. Last Pap Smear (NIL) with IUD placement was here 09/24/2020. Hal Morales, RN

## 2021-05-13 ENCOUNTER — Encounter: Payer: Self-pay | Admitting: Advanced Practice Midwife

## 2021-05-14 ENCOUNTER — Other Ambulatory Visit: Payer: Self-pay | Admitting: Obstetrics and Gynecology

## 2021-05-14 ENCOUNTER — Telehealth: Payer: Self-pay

## 2021-05-14 NOTE — Telephone Encounter (Signed)
Consulted on the plan of care for this client.  I agree with the documented note and actions taken to provide care for this client.  Ola Spurr, CNM

## 2021-05-14 NOTE — Telephone Encounter (Addendum)
Phone call to pt. Pt counseled that [facility] provider has reviewed her concerns and [facility] provider recommends seeing her PCP for care. Pt expresses understanding and plans to call PCP for appt.   In response to my chart pt message: May 13, 2021        1:23 PM Eduard Clos, RN routed this conversation to [facility]Std    Sydnee Levans to Herbie Saxon, CNM       11:33 AM I have a question, or should i contact Dr. Ginette Pitman, but I am having weakness here and there, and now having pain in the same spot on each leg in the thigh area but on the outside of my leg, I am just wondering is loosing blood causing this or is this something else, I just know the headaches and now weakness and pain just coming about was not happening before this heavy bleeding. Can you let me know if you can help me understand or should I contact my doctor?  This encounter is not signed. The conversation may still be ongoing.

## 2021-06-09 NOTE — H&P (Addendum)
Dominique Estes is a 50 y.o. female presenting with Pre Op Consulting  History of Present Illness: The patient presents today with abnormal uterine bleeding. She has used IUDs for birth control and bleeding control for years. We discussed options for bleeding control: pill, patch, ring, injection, implant, ablation and hysterectomy. She has requested a hysterectomy for definitive management of her menorrhagia and AUB  Workup: [id number] FOR INTRAEPITHELIAL LESION OR MALIGNANCY.  EMBx: Specimen A-Endometrial Biopsy: SCANT UNDERDEVELOPED  ENDOMETRIAL GLANDS AND DECIDUALIZED STROMA COMPATIBLE WITH  PROGESTIN EFFECT, MIXED WITH DEGENERATED TISSUE. 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  Past Surgical History:  has a past surgical history that includes Endoscopic Carpal Tunnel Release (Right, 06/2003) and Colposcopy (03-23-2013). Family History: family history includes Bipolar disorder in her mother; Diabetes in her brother, maternal aunt, and mother; Glaucoma in her maternal grandmother; High blood pressure (Hypertension) in her brother; No Known Problems in her father; Obesity in her mother. Social History:  reports that she quit smoking about 9 years ago. Her smoking use included cigarettes. She smoked 1.00 pack per day for 0.00 years. She has never used smokeless tobacco. She reports current alcohol use. She reports previous drug use. OB/GYN History:  OB History     Gravida  6   Para  3   Term  3   Preterm  0   AB  3    Living  3      SAB  0   IAB  0   Ectopic  0   Molar  0   Multiple  0   Live Births  0         Allergies: is allergic to chocolate flavor, peanut, and lysteda [tranexamic acid]. Medications: Current Outpatient Medications:    ascorbic acid (VITA-C ORAL), Take by mouth, Disp: , Rfl:    cyanocobalamin (VITAMIN B12) 1000 MCG tablet, Take 1,000 mcg by mouth once daily, Disp: , Rfl:    ergocalciferol, vitamin D2, 1,250 mcg (50,000 unit) capsule, Take 1 capsule (50,000 Units total) by mouth once a week, Disp: 13 capsule, Rfl: 3   iron polysaccharides (FERREX) 150 mg iron capsule, Take 1 capsule (150 mg total) by mouth once daily (Patient not taking: No sig reported), Disp: 30 capsule, Rfl: 5   levonorgestrel (MIRENA) 20 mcg/24 hr (5 years) IUD, Insert 1 each into the uterus once. Follow package directions., Disp: , Rfl:    phentermine (ADIPEX-P) 37.5 mg tablet, Take 1 tablet (37.5 mg total) by mouth every morning before breakfast, Disp: 30 tablet, Rfl: 0   predniSONE (DELTASONE) 10 MG tablet, 4,4,3,3,2,2,1,1, Disp: 20 tablet, Rfl: 0   tranexamic acid (LYSTEDA) 650 mg tablet, Take 2 tablets (1,300 mg total) by mouth 3 (three) times daily Take for a maximum of 5 days during monthly menstruation., Disp: 30 tablet, Rfl: 1  Review of Systems: No SOB, no palpitations or chest pain, no new lower extremity edema, no nausea or vomiting or bowel or bladder complaints. See  HPI for gyn specific ROS.   Exam:   BP 135/84   Pulse 80   Ht 165.1 cm ('5\' 5"'$ )   Wt (!) 116.4 kg (256 lb 9.6 oz)   BMI 42.70 kg/m   General: Patient is well-groomed, well-nourished, appears stated age in no acute distress   HEENT: head is atraumatic and normocephalic, trachea is midline, neck is supple with no palpable nodules   CV: Regular rhythm and normal heart rate, no murmur   Pulm: Clear to auscultation throughout lung fields with no wheezing, crackles, or rhonchi. No increased work of breathing  Abdomen:  soft , no mass, non-tender, no rebound tenderness, no hepatomegaly  Pelvic: tanner stage 5 ,   External genitalia: vulva /labia no lesions  Urethra: no prolapse  Vagina: normal physiologic d/c, laxity in vaginal walls  Cervix: no lesions, no cervical motion tenderness, good descent, +strings visible   Pap collected today   Endometrial biopsy: The cervix was cleaned with betadine, topical Hurriciane spray applied, and a single tooth tenaculum is applied to the anterior cervix. The Pipelle catheter was placed into the endometrial cavity. It sounds to 7 cm and adequate tissue was removed.  Impression:   The primary encounter diagnosis was Pre-op exam. Diagnoses of Excessive or frequent menstruation, Abnormal uterine bleeding (AUB), Uterine leiomyoma, unspecified location, Encounter for cervical Pap smear with pelvic exam, and Cervical cancer screening were also pertinent to this visit.  Plan:   - Abnormal uterine bleeding, Excessive bleeding:   The patient and I discussed the technical aspects of the procedure including the potential for risks and complications.  These include but are not limited to the risk of infection requiring post-operative antibiotics or further procedures.  We talked about the risk of injury to adjacent organs including bladder, bowel, ureter, blood vessels or nerves.  We talked about the need to convert to a laparoscopy or an open incision.  We talked about the possible need for blood transfusion.  We talked about postop complications such as thromboembolic or cardiopulmonary complications.  All of her questions were answered.     F/u on embx and pap smear completed today   -Patient returns for a preoperative discussion regarding her plans to proceed with surgical treatment of her AUB by total laparoscopic hysterectomy with bilateral salpingectomy TLH/BS procedure.  We may perform a cystoscopy to evaluate the urinary tract after the procedure, if surgically indicated for  uro tract integrity.   The patient and I discussed the technical aspects of the procedure including the potential for risks and complications.  These include but are not limited to the risk of infection requiring post-operative antibiotics or further procedures.  We talked about the risk of injury to adjacent organs including bladder, bowel, ureter, blood vessels or nerves.  We talked about the need to convert to an open incision.  We talked about the possible need for blood transfusion.  We talked about postop complications such as thromboembolic or cardiopulmonary complications.  All of her questions were answered.  Her preoperative exam was completed and the appropriate consents were signed. She is scheduled to undergo this procedure in the near future.  Specific Peri-operative Considerations:  - Consent: obtained today - Health Maintenance:  - Labs: CBC, CMP preoperatively - Studies: EKG, CXR preoperatively - Bowel Preparation: None required - Abx:  Ancef 2g - VTE ppx: SCDs perioperatively   Diagnoses and all orders for this visit:  Pre-op exam  Excessive or frequent menstruation -     Pathology  Report - [facility]  Abnormal uterine bleeding (AUB) -     Pathology Report - [facility]  Uterine leiomyoma, unspecified location -     Pathology Report - [facility]  Encounter for cervical Pap smear with pelvic exam -     IGP, Aptima HPV - [facility]  Cervical cancer screening -     IGP, Aptima HPV - [facility]

## 2021-06-16 ENCOUNTER — Encounter
Admission: RE | Admit: 2021-06-16 | Discharge: 2021-06-16 | Disposition: A | Discharge status: Home / Self Care | Payer: BC Managed Care – PPO | Source: Ambulatory Visit | Attending: Obstetrics and Gynecology | Admitting: Obstetrics and Gynecology

## 2021-06-16 ENCOUNTER — Other Ambulatory Visit: Payer: Self-pay

## 2021-06-16 HISTORY — DX: Personal history of urinary calculi: Z87.442

## 2021-06-16 NOTE — Patient Instructions (Addendum)
Your procedure is scheduled on: Wednesday, September 7 Report to the Registration Desk on the 1st floor of the [facility]. To find out your arrival time, please call [phone] between 1PM - 3PM on: Tuesday, September 6  REMEMBER: Instructions that are not followed completely may result in serious medical risk, up to and including death; or upon the discretion of your surgeon and anesthesiologist your surgery may need to be rescheduled.  Do not eat food after midnight the night before surgery.  No gum chewing, lozengers or hard candies.  You may however, drink CLEAR liquids up to 2 hours before you are scheduled to arrive for your surgery. Do not drink anything within 2 hours of your scheduled arrival time.  Clear liquids include: - water  - apple juice without pulp - gatorade (not RED, PURPLE, OR BLUE) - black coffee or tea (Do NOT add milk or creamers to the coffee or tea) Do NOT drink anything that is not on this list.  DO NOT TAKE ANY MEDICATIONS THE MORNING OF SURGERY   One week prior to surgery: Starting August 31 Stop Anti-inflammatories (NSAIDS) such as Advil, Aleve, Ibuprofen, Motrin, Naproxen, Naprosyn and Aspirin based products such as Excedrin, Goodys Powder, BC Powder. Stop ANY OVER THE COUNTER supplements until after surgery. You may however, continue to take Tylenol if needed for pain up until the day of surgery.  No Alcohol for 24 hours before or after surgery.  No Smoking including e-cigarettes for 24 hours prior to surgery.  No chewable tobacco products for at least 6 hours prior to surgery.  No nicotine patches on the day of surgery.  Do not use any "recreational" drugs for at least a week prior to your surgery.  Please be advised that the combination of cocaine and anesthesia may have negative outcomes, up to and including death. If you test positive for cocaine, your surgery will be cancelled.  On the morning of surgery brush your teeth with toothpaste  and water, you may rinse your mouth with mouthwash if you wish. Do not swallow any toothpaste or mouthwash.  Do not wear jewelry, make-up, hairpins, clips or nail polish.  Do not wear lotions, powders, or perfumes.   Do not shave body from the neck down 48 hours prior to surgery just in case you cut yourself which could leave a site for infection.  Also, freshly shaved skin may become irritated if using the CHG soap.  Contact lenses, hearing aids and dentures may not be worn into surgery.  Do not bring valuables to the hospital. [facility] is not responsible for any missing/lost belongings or valuables.   Use CHG Soap as directed on instruction sheet.  Notify your doctor if there is any change in your medical condition (cold, fever, infection).  Wear comfortable clothing (specific to your surgery type) to the hospital.  After surgery, you can help prevent lung complications by doing breathing exercises.  Take deep breaths and cough every 1-2 hours. Your doctor may order a device called an Incentive Spirometer to help you take deep breaths. When coughing or sneezing, hold a pillow firmly against your incision with both hands. This is called "splinting." Doing this helps protect your incision. It also decreases belly discomfort.  If you are being discharged the day of surgery, you will not be allowed to drive home. You will need a responsible adult (18 years or older) to drive you home and stay with you that night.   If you are taking  public transportation, you will need to have a responsible adult (18 years or older) with you. Please confirm with your physician that it is acceptable to use public transportation.   Please call the [place]. at [phone] if you have any questions about these instructions.  Surgery Visitation Policy:  Patients undergoing a surgery or procedure may have one family member or support person with them as long as that person is not  COVID-19 positive or experiencing its symptoms.  That person may remain in the waiting area during the procedure.

## 2021-06-17 ENCOUNTER — Encounter: Payer: Self-pay | Admitting: Urgent Care

## 2021-06-17 ENCOUNTER — Encounter
Admission: RE | Admit: 2021-06-17 | Discharge: 2021-06-17 | Disposition: A | Discharge status: Home / Self Care | Payer: BC Managed Care – PPO | Source: Ambulatory Visit | Attending: Obstetrics and Gynecology | Admitting: Obstetrics and Gynecology

## 2021-06-17 ENCOUNTER — Other Ambulatory Visit
Admission: RE | Admit: 2021-06-17 | Discharge: 2021-06-17 | Disposition: A | Discharge status: Home / Self Care | Payer: BC Managed Care – PPO | Attending: Obstetrics and Gynecology | Admitting: Obstetrics and Gynecology

## 2021-06-17 DIAGNOSIS — Z01812 Encounter for preprocedural laboratory examination: Secondary | ICD-10-CM | POA: Insufficient documentation

## 2021-06-17 LAB — BASIC METABOLIC PANEL
Anion gap: 7 (ref 5–15)
BUN: 10 mg/dL (ref 6–20)
CO2: 27 mmol/L (ref 22–32)
Calcium: 9 mg/dL (ref 8.9–10.3)
Chloride: 106 mmol/L (ref 98–111)
Creatinine, Ser: 1.11 mg/dL — ABNORMAL HIGH (ref 0.44–1.00)
GFR, Estimated: >60 mL/min (ref >60)
Glucose, Bld: 106 mg/dL — ABNORMAL HIGH (ref 70–99)
Potassium: 3.8 mmol/L (ref 3.5–5.1)
Sodium: 140 mmol/L (ref 135–145)

## 2021-06-17 LAB — CBC
HCT: 38.4 % (ref 36.0–46.0)
Hemoglobin: 13.3 g/dL (ref 12.0–15.0)
MCH: 31.8 pg (ref 26.0–34.0)
MCHC: 34.6 g/dL (ref 30.0–36.0)
MCV: 91.9 fL (ref 80.0–100.0)
Platelets: 382 10*3/uL (ref 150–400)
RBC: 4.18 MIL/uL (ref 3.87–5.11)
RDW: 12.9 % (ref 11.5–15.5)
WBC: 5.4 10*3/uL (ref 4.0–10.5)
nRBC: 0 % (ref 0.0–0.2)

## 2021-06-17 LAB — TYPE AND SCREEN
ABO/RH(D): A POS
Antibody Screen: NEGATIVE

## 2021-06-25 ENCOUNTER — Ambulatory Visit: Payer: BC Managed Care – PPO | Admitting: Certified Registered"

## 2021-06-25 ENCOUNTER — Encounter
Admission: RE | Disposition: A | Discharge status: Home / Self Care | Payer: Self-pay | Source: Home / Self Care | Attending: Obstetrics and Gynecology

## 2021-06-25 ENCOUNTER — Encounter: Payer: Self-pay | Admitting: Obstetrics and Gynecology

## 2021-06-25 ENCOUNTER — Ambulatory Visit
Admission: RE | Admit: 2021-06-25 | Discharge: 2021-06-25 | Disposition: A | Discharge status: Home / Self Care | Payer: BC Managed Care – PPO | Attending: Obstetrics and Gynecology | Admitting: Obstetrics and Gynecology

## 2021-06-25 DIAGNOSIS — Z833 Family history of diabetes mellitus: Secondary | ICD-10-CM | POA: Diagnosis not present

## 2021-06-25 DIAGNOSIS — Z7952 Long term (current) use of systemic steroids: Secondary | ICD-10-CM | POA: Diagnosis not present

## 2021-06-25 DIAGNOSIS — Z820 Family history of epilepsy and other diseases of the nervous system: Secondary | ICD-10-CM | POA: Insufficient documentation

## 2021-06-25 DIAGNOSIS — Z888 Allergy status to other drugs, medicaments and biological substances status: Secondary | ICD-10-CM | POA: Diagnosis not present

## 2021-06-25 DIAGNOSIS — Z8249 Family history of ischemic heart disease and other diseases of the circulatory system: Secondary | ICD-10-CM | POA: Insufficient documentation

## 2021-06-25 DIAGNOSIS — Z79899 Other long term (current) drug therapy: Secondary | ICD-10-CM | POA: Diagnosis not present

## 2021-06-25 DIAGNOSIS — Z91018 Allergy to other foods: Secondary | ICD-10-CM | POA: Insufficient documentation

## 2021-06-25 DIAGNOSIS — Z91010 Allergy to peanuts: Secondary | ICD-10-CM | POA: Diagnosis not present

## 2021-06-25 DIAGNOSIS — Z6841 Body Mass Index (BMI) 40.0 and over, adult: Secondary | ICD-10-CM | POA: Insufficient documentation

## 2021-06-25 DIAGNOSIS — E669 Obesity, unspecified: Secondary | ICD-10-CM | POA: Diagnosis not present

## 2021-06-25 DIAGNOSIS — N939 Abnormal uterine and vaginal bleeding, unspecified: Secondary | ICD-10-CM | POA: Insufficient documentation

## 2021-06-25 DIAGNOSIS — N920 Excessive and frequent menstruation with regular cycle: Secondary | ICD-10-CM | POA: Diagnosis not present

## 2021-06-25 DIAGNOSIS — Z793 Long term (current) use of hormonal contraceptives: Secondary | ICD-10-CM | POA: Insufficient documentation

## 2021-06-25 DIAGNOSIS — Z8349 Family history of other endocrine, nutritional and metabolic diseases: Secondary | ICD-10-CM | POA: Diagnosis not present

## 2021-06-25 DIAGNOSIS — Z87891 Personal history of nicotine dependence: Secondary | ICD-10-CM | POA: Insufficient documentation

## 2021-06-25 DIAGNOSIS — D259 Leiomyoma of uterus, unspecified: Secondary | ICD-10-CM | POA: Insufficient documentation

## 2021-06-25 HISTORY — PX: TOTAL LAPAROSCOPIC HYSTERECTOMY WITH SALPINGECTOMY: SHX6742

## 2021-06-25 LAB — POCT PREGNANCY, URINE: Preg Test, Ur: NEGATIVE

## 2021-06-25 LAB — ABO/RH: ABO/RH(D): A POS

## 2021-06-25 SURGERY — HYSTERECTOMY, TOTAL, LAPAROSCOPIC, WITH SALPINGECTOMY
Anesthesia: General | Laterality: Bilateral

## 2021-06-25 MED ORDER — LIDOCAINE HCL (CARDIAC) PF 100 MG/5ML IV SOSY
PREFILLED_SYRINGE | INTRAVENOUS | Status: DC | PRN
  Administered 2021-06-25 (×2): 50 mg via INTRAVENOUS

## 2021-06-25 MED ORDER — FENTANYL CITRATE (PF) 100 MCG/2ML IJ SOLN
INTRAMUSCULAR | Status: DC | PRN
  Administered 2021-06-25: 50 ug via INTRAVENOUS

## 2021-06-25 MED ORDER — IBUPROFEN 800 MG PO TABS: 800.0000 mg | ORAL_TABLET | Freq: Three times a day (TID) | ORAL | 1 refills | Status: AC

## 2021-06-25 MED ORDER — GABAPENTIN 300 MG PO CAPS: 300.0000 mg | ORAL_CAPSULE | ORAL | Status: AC

## 2021-06-25 MED ORDER — ACETAMINOPHEN 500 MG PO TABS: 1000.0000 mg | ORAL_TABLET | ORAL | Status: AC

## 2021-06-25 MED ORDER — ORAL CARE MOUTH RINSE: 15.0000 mL | Freq: Once | OROMUCOSAL | Status: AC

## 2021-06-25 MED ORDER — PROPOFOL 10 MG/ML IV BOLUS
INTRAVENOUS | Status: DC | PRN
  Administered 2021-06-25: 200 mg via INTRAVENOUS
  Administered 2021-06-25: 50 mg via INTRAVENOUS

## 2021-06-25 MED ORDER — ONDANSETRON 4 MG PO TBDP: 4.0000 mg | ORAL_TABLET | Freq: Four times a day (QID) | ORAL | 0 refills | Status: DC | PRN

## 2021-06-25 MED ORDER — OXYCODONE HCL 5 MG PO TABS
ORAL_TABLET | ORAL | Status: AC
  Filled 2021-06-25: qty 1

## 2021-06-25 MED ORDER — KETAMINE HCL 50 MG/5ML IJ SOSY
PREFILLED_SYRINGE | INTRAMUSCULAR | Status: AC
  Filled 2021-06-25: qty 5

## 2021-06-25 MED ORDER — GABAPENTIN 800 MG PO TABS: 800.0000 mg | ORAL_TABLET | Freq: Every day | ORAL | 0 refills | Status: DC

## 2021-06-25 MED ORDER — KETOROLAC TROMETHAMINE 30 MG/ML IJ SOLN
INTRAMUSCULAR | Status: DC | PRN
  Administered 2021-06-25: 30 mg via INTRAVENOUS

## 2021-06-25 MED ORDER — FAMOTIDINE 20 MG PO TABS: 20.0000 mg | ORAL_TABLET | Freq: Once | ORAL | Status: AC

## 2021-06-25 MED ORDER — CEFAZOLIN SODIUM-DEXTROSE 2-4 GM/100ML-% IV SOLN
2.0000 g | INTRAVENOUS | Status: AC
  Administered 2021-06-25: 2 g via INTRAVENOUS

## 2021-06-25 MED ORDER — ACETAMINOPHEN 500 MG PO TABS: 1000.0000 mg | ORAL_TABLET | Freq: Four times a day (QID) | ORAL | 0 refills | Status: AC

## 2021-06-25 MED ORDER — GABAPENTIN 300 MG PO CAPS
ORAL_CAPSULE | ORAL | Status: AC
  Administered 2021-06-25: 300 mg via ORAL
  Filled 2021-06-25: qty 1

## 2021-06-25 MED ORDER — MIDAZOLAM HCL 2 MG/2ML IJ SOLN
INTRAMUSCULAR | Status: AC
  Filled 2021-06-25: qty 2

## 2021-06-25 MED ORDER — EPHEDRINE SULFATE 50 MG/ML IJ SOLN
INTRAMUSCULAR | Status: DC | PRN
  Administered 2021-06-25: 10 mg via INTRAVENOUS

## 2021-06-25 MED ORDER — ROCURONIUM BROMIDE 100 MG/10ML IV SOLN
INTRAVENOUS | Status: DC | PRN
  Administered 2021-06-25: 50 mg via INTRAVENOUS
  Administered 2021-06-25: 20 mg via INTRAVENOUS

## 2021-06-25 MED ORDER — DEXMEDETOMIDINE (PRECEDEX) IN NS 20 MCG/5ML (4 MCG/ML) IV SYRINGE
PREFILLED_SYRINGE | INTRAVENOUS | Status: DC | PRN
  Administered 2021-06-25: 8 ug via INTRAVENOUS
  Administered 2021-06-25: 12 ug via INTRAVENOUS

## 2021-06-25 MED ORDER — FAMOTIDINE 20 MG PO TABS
ORAL_TABLET | ORAL | Status: AC
  Administered 2021-06-25: 20 mg via ORAL
  Filled 2021-06-25: qty 1

## 2021-06-25 MED ORDER — 0.9 % SODIUM CHLORIDE (POUR BTL) OPTIME
TOPICAL | Status: DC | PRN
  Administered 2021-06-25: 25 mL

## 2021-06-25 MED ORDER — DOCUSATE SODIUM 100 MG PO CAPS: 100.0000 mg | ORAL_CAPSULE | Freq: Two times a day (BID) | ORAL | 0 refills | Status: DC

## 2021-06-25 MED ORDER — KETAMINE HCL 10 MG/ML IJ SOLN
INTRAMUSCULAR | Status: DC | PRN
  Administered 2021-06-25 (×2): 25 mg via INTRAVENOUS

## 2021-06-25 MED ORDER — FENTANYL CITRATE (PF) 100 MCG/2ML IJ SOLN: 25.0000 ug | INTRAMUSCULAR | Status: DC | PRN

## 2021-06-25 MED ORDER — POVIDONE-IODINE 10 % EX SWAB: 2.0000 "application " | Freq: Once | CUTANEOUS | Status: DC

## 2021-06-25 MED ORDER — BUPIVACAINE HCL (PF) 0.5 % IJ SOLN
INTRAMUSCULAR | Status: AC
  Filled 2021-06-25: qty 30

## 2021-06-25 MED ORDER — CHLORHEXIDINE GLUCONATE 0.12 % MT SOLN
15.0000 mL | Freq: Once | OROMUCOSAL | Status: AC
  Administered 2021-06-25: 15 mL via OROMUCOSAL

## 2021-06-25 MED ORDER — ONDANSETRON HCL 4 MG/2ML IJ SOLN
INTRAMUSCULAR | Status: DC | PRN
  Administered 2021-06-25: 4 mg via INTRAVENOUS

## 2021-06-25 MED ORDER — OXYCODONE HCL 5 MG PO TABS: 5.0000 mg | ORAL_TABLET | ORAL | 0 refills | Status: DC | PRN

## 2021-06-25 MED ORDER — MEPERIDINE HCL 25 MG/ML IJ SOLN: 6.2500 mg | INTRAMUSCULAR | Status: DC | PRN

## 2021-06-25 MED ORDER — FENTANYL CITRATE (PF) 100 MCG/2ML IJ SOLN
INTRAMUSCULAR | Status: AC
  Filled 2021-06-25: qty 2

## 2021-06-25 MED ORDER — MIDAZOLAM HCL 2 MG/2ML IJ SOLN
INTRAMUSCULAR | Status: DC | PRN
  Administered 2021-06-25: 2 mg via INTRAVENOUS

## 2021-06-25 MED ORDER — PROPOFOL 10 MG/ML IV BOLUS
INTRAVENOUS | Status: AC
  Filled 2021-06-25: qty 20

## 2021-06-25 MED ORDER — OXYCODONE HCL 5 MG PO TABS
5.0000 mg | ORAL_TABLET | Freq: Once | ORAL | Status: AC
  Administered 2021-06-25: 5 mg via ORAL

## 2021-06-25 MED ORDER — SODIUM CHLORIDE 0.9 % IR SOLN
Status: DC | PRN
  Administered 2021-06-25: 500 mL

## 2021-06-25 MED ORDER — SUGAMMADEX SODIUM 200 MG/2ML IV SOLN
INTRAVENOUS | Status: DC | PRN
  Administered 2021-06-25: 200 mg via INTRAVENOUS

## 2021-06-25 MED ORDER — ONDANSETRON HCL 4 MG/2ML IJ SOLN: 4.0000 mg | Freq: Once | INTRAMUSCULAR | Status: DC | PRN

## 2021-06-25 MED ORDER — ACETAMINOPHEN 500 MG PO TABS
ORAL_TABLET | ORAL | Status: AC
  Administered 2021-06-25: 1000 mg via ORAL
  Filled 2021-06-25: qty 2

## 2021-06-25 MED ORDER — BUPIVACAINE HCL 0.5 % IJ SOLN
INTRAMUSCULAR | Status: DC | PRN
  Administered 2021-06-25: 15 mL

## 2021-06-25 MED ORDER — DEXAMETHASONE SODIUM PHOSPHATE 10 MG/ML IJ SOLN
INTRAMUSCULAR | Status: DC | PRN
  Administered 2021-06-25: 10 mg via INTRAVENOUS

## 2021-06-25 MED ORDER — LACTATED RINGERS IV SOLN: INTRAVENOUS | Status: DC

## 2021-06-25 MED ORDER — CEFAZOLIN SODIUM-DEXTROSE 2-4 GM/100ML-% IV SOLN
INTRAVENOUS | Status: AC
  Filled 2021-06-25: qty 100

## 2021-06-25 SURGICAL SUPPLY — 67 items
ADH SKN CLS APL DERMABOND .7 (GAUZE/BANDAGES/DRESSINGS) ×1
APL PRP STRL LF DISP 70% ISPRP (MISCELLANEOUS)
APL SRG 38 LTWT LNG FL B (MISCELLANEOUS)
APPLICATOR ARISTA FLEXITIP XL (MISCELLANEOUS) IMPLANT
BAG DRN RND TRDRP ANRFLXCHMBR (UROLOGICAL SUPPLIES) ×1
BAG URINE DRAIN 2000ML AR STRL (UROLOGICAL SUPPLIES) ×2 IMPLANT
BLADE SURG SZ11 CARB STEEL (BLADE) ×2 IMPLANT
CATH FOLEY 2WAY  5CC 16FR (CATHETERS) ×1
CATH FOLEY 2WAY 5CC 16FR (CATHETERS) ×1
CATH URTH 16FR FL 2W BLN LF (CATHETERS) ×1 IMPLANT
CHLORAPREP W/TINT 26 (MISCELLANEOUS) IMPLANT
CORD MONOPOLAR M/FML 12FT (MISCELLANEOUS) ×2 IMPLANT
COUNTER NEEDLE 20/40 LG (NEEDLE) ×2 IMPLANT
COVER LIGHT HANDLE STERIS (MISCELLANEOUS) ×4 IMPLANT
DERMABOND ADVANCED (GAUZE/BANDAGES/DRESSINGS) ×1
DERMABOND ADVANCED .7 DNX12 (GAUZE/BANDAGES/DRESSINGS) ×1 IMPLANT
DEVICE SUTURE ENDOST 10MM (ENDOMECHANICALS) ×2 IMPLANT
DRAPE GENERAL ENDO 106X123.5 (DRAPES) ×2 IMPLANT
DRSG TEGADERM 2-3/8X2-3/4 SM (GAUZE/BANDAGES/DRESSINGS) ×6 IMPLANT
GAUZE 4X4 16PLY ~~LOC~~+RFID DBL (SPONGE) ×4 IMPLANT
GLOVE SURG ENC MOIS LTX SZ7 (GLOVE) ×6 IMPLANT
GLOVE SURG UNDER LTX SZ7.5 (GLOVE) ×4 IMPLANT
GOWN STRL REUS W/ TWL LRG LVL3 (GOWN DISPOSABLE) ×3 IMPLANT
GOWN STRL REUS W/ TWL XL LVL3 (GOWN DISPOSABLE) ×1 IMPLANT
GOWN STRL REUS W/TWL LRG LVL3 (GOWN DISPOSABLE) ×6
GOWN STRL REUS W/TWL XL LVL3 (GOWN DISPOSABLE) ×2
GRASPER SUT TROCAR 14GX15 (MISCELLANEOUS) ×2 IMPLANT
HEMOSTAT ARISTA ABSORB 3G PWDR (HEMOSTASIS) IMPLANT
IRRIGATION STRYKERFLOW (MISCELLANEOUS) ×1 IMPLANT
IRRIGATOR STRYKERFLOW (MISCELLANEOUS) ×2
IV NS 1000ML (IV SOLUTION) ×2
IV NS 1000ML BAXH (IV SOLUTION) ×1 IMPLANT
KIT PINK PAD W/HEAD ARE REST (MISCELLANEOUS) ×2
KIT PINK PAD W/HEAD ARM REST (MISCELLANEOUS) ×1 IMPLANT
KIT TURNOVER CYSTO (KITS) ×2 IMPLANT
L-HOOK LAP DISP 36CM (ELECTROSURGICAL) ×2
LABEL OR SOLS (LABEL) ×2 IMPLANT
LHOOK LAP DISP 36CM (ELECTROSURGICAL) ×1 IMPLANT
LIGASURE VESSEL 5MM BLUNT TIP (ELECTROSURGICAL) IMPLANT
MANIFOLD NEPTUNE II (INSTRUMENTS) ×2 IMPLANT
MANIPULATOR VCARE LG CRV RETR (MISCELLANEOUS) IMPLANT
MANIPULATOR VCARE SML CRV RETR (MISCELLANEOUS) IMPLANT
MANIPULATOR VCARE STD CRV RETR (MISCELLANEOUS) ×2 IMPLANT
NS IRRIG 500ML POUR BTL (IV SOLUTION) ×2 IMPLANT
OCCLUDER COLPOPNEUMO (BALLOONS) ×2 IMPLANT
PACK GYN LAPAROSCOPIC (MISCELLANEOUS) ×2 IMPLANT
PAD OB MATERNITY 4.3X12.25 (PERSONAL CARE ITEMS) ×2 IMPLANT
PAD PREP 24X41 OB/GYN DISP (PERSONAL CARE ITEMS) ×2 IMPLANT
PENCIL ELECTRO HAND CTR (MISCELLANEOUS) ×2 IMPLANT
SCISSORS METZENBAUM CVD 33 (INSTRUMENTS) ×2 IMPLANT
SCRUB EXIDINE 4% CHG 4OZ (MISCELLANEOUS) ×2 IMPLANT
SET CYSTO W/LG BORE CLAMP LF (SET/KITS/TRAYS/PACK) ×2 IMPLANT
SLEEVE ENDOPATH XCEL 5M (ENDOMECHANICALS) ×2 IMPLANT
SOL PREP PROV IODINE SCRUB 4OZ (MISCELLANEOUS) ×2 IMPLANT
SPONGE GAUZE 2X2 8PLY STRL LF (GAUZE/BANDAGES/DRESSINGS) ×4 IMPLANT
SUT ENDO VLOC 180-0-8IN (SUTURE) ×2 IMPLANT
SUT MNCRL 4-0 (SUTURE) ×2
SUT MNCRL 4-0 27XMFL (SUTURE) ×1
SUT VIC AB 0 CT1 36 (SUTURE) ×2 IMPLANT
SUTURE MNCRL 4-0 27XMF (SUTURE) ×1 IMPLANT
SYR 10ML LL (SYRINGE) ×2 IMPLANT
SYR 50ML LL SCALE MARK (SYRINGE) ×2 IMPLANT
TOWEL OR 17X26 4PK STRL BLUE (TOWEL DISPOSABLE) ×4 IMPLANT
TROCAR ENDO BLADELESS 11MM (ENDOMECHANICALS) ×2 IMPLANT
TROCAR XCEL NON-BLD 5MMX100MML (ENDOMECHANICALS) ×2 IMPLANT
TUBING EVAC SMOKE HEATED PNEUM (TUBING) ×2 IMPLANT
WATER STERILE IRR 500ML POUR (IV SOLUTION) ×2 IMPLANT

## 2021-06-25 NOTE — Op Note (Addendum)
Sydnee Levans PROCEDURE DATE: 06/25/2021  PREOPERATIVE DIAGNOSIS: AUB-F failed medical management POSTOPERATIVE DIAGNOSIS: The same, possible adenomyosis PROCEDURE:  TOTAL LAPAROSCOPIC HYSTERECTOMY WITH SALPINGECTOMY, CYSTOSCOPY:   SURGEON:  Dr. [name], MD ASSISTANT: Dr. Laverta Baltimore, MD  Anesthesiologist:  Anesthesiologist: Phill Mutter, MD CRNA: Lerry Liner, CRNA; Biagio Borg, CRNA  INDICATIONS: 50 y.o. [phone]  here for definitive surgical management secondary to the indications listed under preoperative diagnoses; please see preoperative note for further details.  Risks of surgery were discussed with the patient including but not limited to: bleeding which may require transfusion or reoperation; infection which may require antibiotics; injury to bowel, bladder, ureters or other surrounding organs; need for additional procedures; thromboembolic phenomenon, incisional problems and other postoperative/anesthesia complications. Written informed consent was obtained.    FINDINGS:  Enlarged bulbous uterus with intramural fibroids. Normal bladder and ureteral os. Normal upper abdomen. The IUD strings were noted at the external os and were left in situ.  ANESTHESIA:    General INTRAVENOUS FLUIDS:1000  ml ESTIMATED BLOOD LOSS:75 ml URINE OUTPUT: 300 ml   SPECIMENS: Uterus, cervix, bilateral fallopian tubes with IUD in the uterus sent off COMPLICATIONS: None immediate  PROCEDURE IN DETAIL:  The patient received prophalactic intravenous antibiotics and had sequential compression devices applied to her lower extremities while in the preoperative area.  She was then taken to the operating room where general anesthesia was administered and was found to be adequate.  She was placed in the dorsal lithotomy position, and was prepped and draped in a sterile manner.  A formal time out was performed with all team members present and in agreement.  A V-care uterine  manipulator was placed at this time.  A Foley catheter was inserted into her bladder and attached to constant drainage. Attention was turned to the abdomen and 0.5% Marcaine infused subq. A 58m umbilical incision was made with the scalpel.  The Optiview 5-mm trocar and sleeve were then advanced without difficulty with the laparoscope under direct visualization into the abdomen.  The abdomen was then insufflated with carbon dioxide gas and adequate pneumoperitoneum was obtained.  A survey of the patient's pelvis and abdomen revealed the findings above.  Bilateral lower quadrant ports (5 mm on the right and 11 mm on the left) were then placed under direct visualization.  The pelvis was then carefully examined.  Attention was turned to the fallopian tubes; these were freed from the underlying mesosalpinx and the uterine attachments using the Ligasure device.  The bilateral round and broad ligaments were then clamped and transected with the Ligasure device.  The uterine artery was then skeletonized and a bladder flap was created.  The ureters were noted to be safely away from the area of dissection.  The bladder was then bluntly dissected off the lower uterine segment.    At this point, attention was turned to the uterine vessels, which were clamped and cauterized using the Ligasure on the left, and then the right. After the uterine blood flow at the level of the internal os was controlled, both arteries were cut with the Ligasure.  Good hemostasis was noted overall, though bleeding at the right ovarian was slow to control, with notable back bleeding from the uterus.  The uterosacral and cardinal ligaments were clamped, cut and ligated bilaterally .  Attention was then turned to the cervicovaginal junction, and monopolar bovie hook was used to transect the cervix from the surrounding vagina using the ring of the V-care as a guide.  This was done circumferentially allowing total hysterectomy.  The uterus was then  removed from the vagina and the vaginal cuff incision was then closed with running V-loc suture.  Overall excellent hemostasis was noted.    Attention was returned to the abdomen.The ureters were reexamined bilaterally and were pulsating normally. The abdominal pressure was reduced and hemostasis was confirmed.     Separate cystoscopy showed bilateral ureteral jets.  No stitches were visualized in the bladder during cystoscopy.  The 77m port fascia was closed with a vertical mattress with 0-Vicryl, using the cone closure system. All trocars were removed under direct visualization, and the abdomen was desufflated.  All skin incisions were closed with 4-0 Vicryl subcuticular stitches and Dermabond. The patient tolerated the procedures well.  All instruments, needles, and sponge counts were correct x 2. The patient was taken to the recovery room awake, extubated and in stable condition.

## 2021-06-25 NOTE — Interval H&P Note (Signed)
History and Physical Interval Note:  06/25/2021 8:24 AM  Dominique Estes  has presented today for surgery, with the diagnosis of AUB, failed medical management.  The various methods of treatment have been discussed with the patient and family. After consideration of risks, benefits and other options for treatment, the patient has consented to  Procedure(s): TOTAL LAPAROSCOPIC HYSTERECTOMY WITH SALPINGECTOMY (Bilateral) as a surgical intervention.  The patient's history has been reviewed, patient examined, no change in status, stable for surgery.  I have reviewed the patient's chart and labs.  Questions were answered to the patient's satisfaction.     [name]

## 2021-06-25 NOTE — Anesthesia Preprocedure Evaluation (Signed)
Anesthesia Evaluation  Patient identified by MRN, date of birth, ID band Patient awake    Reviewed: Allergy & Precautions, NPO status , Patient's Chart, lab work & pertinent test results  Airway Mallampati: II  TM Distance: >3 FB Neck ROM: Full    Dental no notable dental hx.    Pulmonary neg pulmonary ROS, Current Smoker and Patient abstained from smoking.,    Pulmonary exam normal        Cardiovascular negative cardio ROS Normal cardiovascular exam     Neuro/Psych  Neuromuscular disease negative psych ROS   GI/Hepatic negative GI ROS, Neg liver ROS,   Endo/Other  Morbid obesity  Renal/GU negative Renal ROS  negative genitourinary   Musculoskeletal negative musculoskeletal ROS (+)   Abdominal   Peds negative pediatric ROS (+)  Hematology negative hematology ROS (+)   Anesthesia Other Findings Kidney Stones Smoke Obese  Reproductive/Obstetrics negative OB ROS                             Anesthesia Physical Anesthesia Plan  ASA: 2  Anesthesia Plan: General   Post-op Pain Management:    Induction: Intravenous  PONV Risk Score and Plan: 3 and Propofol infusion and Midazolam  Airway Management Planned: Oral ETT  Additional Equipment:   Intra-op Plan:   Post-operative Plan: Extubation in OR  Informed Consent: I have reviewed the patients History and Physical, chart, labs and discussed the procedure including the risks, benefits and alternatives for the proposed anesthesia with the patient or authorized representative who has indicated his/her understanding and acceptance.       Plan Discussed with: CRNA, Anesthesiologist and Surgeon  Anesthesia Plan Comments:         Anesthesia Quick Evaluation

## 2021-06-25 NOTE — Discharge Instructions (Addendum)
Discharge instructions after   total laparoscopic hysterectomy   For the next three days, take ibuprofen and acetaminophen on a schedule, every 8 hours. You can take them together or you can intersperse them, and take one every four hours. I also gave you gabapentin for nighttime, to help you sleep and also to control pain. Take gabapentin medicines at night for at least the next 3 nights. You also have a narcotic, oxycodone, to take as needed if the above medicines don't help.  Postop constipation is a major cause of pain. Stay well hydrated, walk as you tolerate, and take over the counter senna as well as stool softeners if you need them.    Signs and Symptoms to Report Call our office at [phone] if you have any of the following.   Fever over 100.4 degrees or higher  Severe stomach pain not relieved with pain medications  Bright red bleeding that's heavier than a period that does not slow with rest  To go the bathroom a lot (frequency), you can't hold your urine (urgency), or it hurts when you empty your bladder (urinate)  Chest pain  Shortness of breath  Pain in the calves of your legs  Severe nausea and vomiting not relieved with anti-nausea medications  Signs of infection around your wounds, such as redness, hot to touch, swelling, green/yellow drainage (like pus), bad smelling discharge  Any concerns  What You Can Expect after Surgery  You may see some pink tinged, bloody fluid and bruising around the wound. This is normal.  You may notice shoulder and neck pain. This is caused by the gas used during surgery to expand your abdomen so your surgeon could get to the uterus easier.  You may have a sore throat because of the tube in your mouth during general anesthesia. This will go away in 2 to 3 days.  You may have some stomach cramps.  You may notice spotting on your panties.  You may have pain around the incision sites.   Activities after Your Discharge Follow these  guidelines to help speed your recovery at home:  Do the coughing and deep breathing as you did in the hospital for 2 weeks. Use the small blue breathing device, called the incentive spirometer for 2 weeks.  Don't drive if you are in pain or taking narcotic pain medicine. You may drive when you can safely slam on the brakes, turn the wheel forcefully, and rotate your torso comfortably. This is typically 1-2 weeks. Practice in a parking lot or side street prior to attempting to drive regularly.   Ask others to help with household chores for 4 weeks.  Do not lift anything heavier that 10 pounds for 4-6 weeks. This includes pets, children, and groceries.  Don't do strenuous activities, exercises, or sports like vacuuming, tennis, squash, etc. until your doctor says it is safe to do so. ---Maintain pelvic rest for 8 weeks. This means nothing in the vagina or rectum at all (no douching, tampons, intercourse) for 8 weeks.   Walk as you feel able. Rest often since it may take two or three weeks for your energy level to return to normal.   You may climb stairs  Avoid constipation:   -Eat fruits, vegetables, and whole grains. Eat small meals as your appetite will take time to return to normal.   -Drink 6 to 8 glasses of water each day unless your doctor has told you to limit your fluids.   -Use a laxative   or stool softener as needed if constipation becomes a problem. You may take Miralax, metamucil, Citrucil, Colace, Senekot, FiberCon, etc. If this does not relieve the constipation, try two tablespoons of Milk Of Magnesia every 8 hours until your bowels move.   You may shower. Gently wash the wounds with a mild soap and water. Pat dry.  Do not get in a hot tub, swimming pool, etc. for 6 weeks.  Do not use lotions, oils, powders on the wounds.  Do not douche, use tampons, or have sex until your doctor says it is okay.  Take your pain medicine when you need it. The medicine may not work as well if the pain is  bad.  Take the medicines you were taking before surgery. Other medications you will need are pain medications and possibly constipation and nausea medications (Zofran).  Here is a helpful article from the website [employer], regarding constipation  Here are reasons why constipation occurs after surgery: 1) During the operation and in the recovery room, most people are given opioid pain medication, primarily through an IV, to treat moderate or severe pain. Intravenous opioids include morphine, Dilaudid and fentanyl. After surgery, patients are often prescribed opioid pain medication to take by mouth at home, including codeine, Vicodin, Norco, and Percocet. All of these medications cause constipation by slowing down the movement of your intestine. 2) Changes in your diet before surgery can be another culprit. It is common to get specific instructions to change how you normally eat or drink before your surgery, like only having liquids the day before or not having anything to eat or drink after midnight the night before surgery. For this reason, temporary dehydration may occur. This, along with not eating or only having liquids, means that you are getting less fiber than usual. Both these factors contribute to constipation. 3) Changes in your diet after surgery can also contribute to the problem. Although many people don't have dietary restrictions after operations, being under anesthesia can make you lose your appetite for several hours and maybe even days. Some people can even have nausea or vomiting. Not eating or drinking normally means that you are not getting enough fiber and you can get dehydrated, both leading to constipation. 4) Lying in a bed more than usual--which happens before, during and after surgery--combined with the medications and diet changes, all work together to slow down your colon and make your poop turn to rock.  No one likes to be constipated.  Let's face it, it's not a pleasant  feeling when you don't poop for days, then strain on the toilet to finally pass something large enough to cause damage. An ounce of prevention is worth a pound of cure, so: Assume you will be constipated. Plan and prepare accordingly. Post-surgery is one of those unique situations where the temporary use of laxatives can make a world of difference. Always consult with your doctor, and recognize that if you wait several days after surgery to take a laxative, the constipation might be too severe for these over-the-counter options. It is always important to discuss all medications you plan on taking with your doctor. Ask your doctor if you can start the laxative immediately after surgery. *  Here are go-to post-surgery laxatives: Senna: Senna is an herb that acts as a "stimulant laxative," meaning it increases the activity of the intestine to cause you to have a bowel movement. It comes in many forms, but senna pills are easy to take and are sold over the   counter at almost all pharmacies. Since opioid pain medications slow down the activity of the intestine, it makes sense to take a medication to help reverse that side effect. Long-term use of a stimulant laxative is not a good idea since it can make your colon "lazy" and not function properly; however, temporary use immediately after surgery is acceptable. In general, if you are able to eat a normal diet, taking senna soon after surgery works the best. Senna usually works within hours to produce a bowel movement, but this is less predictable when you are taking different medications after surgery. Try not to wait several days to start taking senna, as often it is too late by then. Just like with all medications or supplements, check with your doctor before starting new treatment.   Magnesium: Magnesium is an important mineral that our body needs. We get magnesium from some foods that we eat, especially foods that are high in fiber such as broccoli, almonds and  whole grains. There are also magnesium-based medications used to treat constipation including milk of magnesia (magnesium hydroxide), magnesium citrate and magnesium oxide. They work by drawing water into the intestine, putting it into the class of "osmotic" laxatives. Magnesium products in low doses appear to be safe, but if taken in very large doses, can lead to problems such as irregular heartbeat, low blood pressure and even death. It can also affect other medications you might be taking, therefore it is important to discuss using magnesium with your physician and pharmacist before initiating therapy. Most over-the-counter magnesium laxatives work very well to help with the constipation related to surgery, but sometimes they work too well and lead to diarrhea. Make sure you are somewhere with easy access to a bathroom, just in case.   Bisacodyl: Bisacodyl (generic name) is sold under brand names such as Dulcolax. Much like senna, it is a "stimulant laxative," meaning it makes your intestines move more quickly to push out the stool. This is another good choice to start taking as soon as your doctor says you can take a laxative after surgery. It comes in pill form and as a suppository, which is a good choice for people who cannot or are not allowed to swallow pills. Studies have shown that it works as a laxative, but like most of these medications, you should use this on a short-term basis only.   Enema: Enemas strike fear in many people, but FEAR NOT! It's nowhere near as big a deal as you may think. An enema is just a way to get some liquid into your rectum by placing a specially designed device through your anus. If you have never done one, it might seem like a painful, unpleasant, uncomfortable, complicated and lengthy procedure. But in reality, it's simple, takes just a few seconds and is highly effective. The small ready-made bottles you buy at the pharmacy are much easier than the hose/large rubber  container type. Those recommended positions illustrated in some instructions are generally not necessary to place the enema. It's very similar to the insertion of a tampon, requiring a slight squat. Some extra lubrication on the enema's tip (or on your anus) will make it a breeze. In certain cases, there is no substitute for a good enema. For example, if someone has not pooped for a few days, the beginning of the poop waiting to come out can become rock hard. Passing that hard stool can lead to much pain and problems like anal fissures. Inserting a little liquid to   break up the rock-hard stool will help make its passage much easier. Enemas come with different liquids. Most come with saline, but there are also mineral oil options. You can also use warm water in the reusable enema containers. They all work. But since saline can sometimes be irritating, so try a mineral oil or water enema instead.  Here are commonly recommended constipation medications that do not work well for post-surgery constipation: Docusate: Docusate (generic name) most commonly referred to as Colace (brand name) is not really a laxative, but is classified as a stool softener. Although this medication is commonly prescribed, it is not recommended for several reasons: 1) there is no good medical evidence that it works 2) even if it has an effect, which is very questionable, it is minimal and cannot combat the intestinal slowing caused by the opioid medications. Skip docusate to save money and space in your pillbox for something more effective.  PEG: Miralax (brand name) is basically a chemical called polyethylene glycol (PEG) and it has gained tremendous popularity as a laxative. This product is an "osmotic laxative" meaning it works by pulling water into the stool, making it softer. This is very similar to the action of natural fiber in foods and supplements. Therefore, the effect seen by this medication is not immediate, causing a bowel  movement in a day or more. Is this medication strong enough to battle the constipation related to having an operation? Maybe for some people not prone to constipation. But for most people, other laxatives are better to prevent constipation after surgery.      AMBULATORY SURGERY  DISCHARGE INSTRUCTIONS   The drugs that you were given will stay in your system until tomorrow so for the next 24 hours you should not:  Drive an automobile Make any legal decisions Drink any alcoholic beverage   You may resume regular meals tomorrow.  Today it is better to start with liquids and gradually work up to solid foods.  You may eat anything you prefer, but it is better to start with liquids, then soup and crackers, and gradually work up to solid foods.   Please notify your doctor immediately if you have any unusual bleeding, trouble breathing, redness and pain at the surgery site, drainage, fever, or pain not relieved by medication.    Additional Instructions:        Please contact your physician with any problems or Same Day Surgery at [phone], Monday through Friday 6 am to 4 pm, or [facility] [place] Main number at [phone].  

## 2021-06-25 NOTE — Anesthesia Procedure Notes (Signed)
Procedure Name: Intubation Date/Time: 06/25/2021 8:42 AM Performed by: Biagio Borg, CRNA Pre-anesthesia Checklist: Patient identified, Emergency Drugs available, Suction available and Patient being monitored Patient Re-evaluated:Patient Re-evaluated prior to induction Oxygen Delivery Method: Circle system utilized Preoxygenation: Pre-oxygenation with 100% oxygen Induction Type: IV induction Ventilation: Mask ventilation without difficulty Laryngoscope Size: McGraph and 3 Grade View: Grade I Tube type: Oral Tube size: 7.0 mm Number of attempts: 1 Airway Equipment and Method: Stylet and Oral airway Placement Confirmation: ETT inserted through vocal cords under direct vision, positive ETCO2 and breath sounds checked- equal and bilateral Secured at: 21 cm Tube secured with: Tape Dental Injury: Teeth and Oropharynx as per pre-operative assessment

## 2021-06-25 NOTE — Transfer of Care (Signed)
Immediate Anesthesia Transfer of Care Note  Patient: Dominique Estes  Procedure(s) Performed: TOTAL LAPAROSCOPIC HYSTERECTOMY WITH SALPINGECTOMY, CYSTOSCOPY (Bilateral)  Patient Location: PACU  Anesthesia Type:General  Level of Consciousness: drowsy  Airway & Oxygen Therapy: Patient Spontanous Breathing and Patient connected to face mask oxygen  Post-op Assessment: Report given to RN and Post -op Vital signs reviewed and stable  Post vital signs: Reviewed and stable  Last Vitals:  Vitals Value Taken Time  BP 114/67 06/25/21 1119  Temp 36.3 C 06/25/21 1119  Pulse 84 06/25/21 1122  Resp 10 06/25/21 1122  SpO2 100 % 06/25/21 1122  Vitals shown include unvalidated device data.  Last Pain:  Vitals:   06/25/21 0713  TempSrc: Temporal  PainSc: 0-No pain         Complications: No notable events documented.

## 2021-06-25 NOTE — Anesthesia Postprocedure Evaluation (Signed)
Anesthesia Post Note  Patient: Dominique Estes  Procedure(s) Performed: TOTAL LAPAROSCOPIC HYSTERECTOMY WITH SALPINGECTOMY, CYSTOSCOPY (Bilateral)  Patient location during evaluation: PACU Anesthesia Type: General Level of consciousness: awake and alert, awake and oriented Pain management: pain level controlled Vital Signs Assessment: post-procedure vital signs reviewed and stable Respiratory status: spontaneous breathing, nonlabored ventilation and respiratory function stable Cardiovascular status: blood pressure returned to baseline and stable Postop Assessment: no apparent nausea or vomiting Anesthetic complications: no   No notable events documented.   Last Vitals:  Vitals:   06/25/21 1156 06/25/21 1245  BP: 120/77 124/68  Pulse: 64 65  Resp: 17 15  Temp: (!) 36.1 C   SpO2: 100% 100%    Last Pain:  Vitals:   06/25/21 1245  TempSrc:   PainSc: 4                  Phill Mutter

## 2021-06-26 LAB — SURGICAL PATHOLOGY

## 2021-07-25 ENCOUNTER — Other Ambulatory Visit: Payer: Medicaid Other

## 2021-07-28 ENCOUNTER — Other Ambulatory Visit: Payer: Medicaid Other

## 2021-09-23 ENCOUNTER — Emergency Department: Payer: BC Managed Care – PPO

## 2021-09-23 ENCOUNTER — Other Ambulatory Visit: Payer: Self-pay

## 2021-09-23 ENCOUNTER — Observation Stay
Admission: EM | Admit: 2021-09-23 | Discharge: 2021-09-25 | Disposition: A | Discharge status: Home / Self Care | Payer: BC Managed Care – PPO | Attending: Internal Medicine | Admitting: Internal Medicine

## 2021-09-23 DIAGNOSIS — R200 Anesthesia of skin: Secondary | ICD-10-CM | POA: Diagnosis present

## 2021-09-23 DIAGNOSIS — F17210 Nicotine dependence, cigarettes, uncomplicated: Secondary | ICD-10-CM | POA: Insufficient documentation

## 2021-09-23 DIAGNOSIS — M545 Low back pain, unspecified: Secondary | ICD-10-CM | POA: Insufficient documentation

## 2021-09-23 DIAGNOSIS — Z20822 Contact with and (suspected) exposure to covid-19: Secondary | ICD-10-CM | POA: Diagnosis not present

## 2021-09-23 DIAGNOSIS — G935 Compression of brain: Secondary | ICD-10-CM

## 2021-09-23 DIAGNOSIS — M5442 Lumbago with sciatica, left side: Secondary | ICD-10-CM

## 2021-09-23 LAB — BASIC METABOLIC PANEL
Anion gap: 8 (ref 5–15)
BUN: 10 mg/dL (ref 6–20)
CO2: 24 mmol/L (ref 22–32)
Calcium: 8.7 mg/dL — ABNORMAL LOW (ref 8.9–10.3)
Chloride: 104 mmol/L (ref 98–111)
Creatinine, Ser: 0.75 mg/dL (ref 0.44–1.00)
GFR, Estimated: >60 mL/min (ref >60)
Glucose, Bld: 112 mg/dL — ABNORMAL HIGH (ref 70–99)
Potassium: 3.7 mmol/L (ref 3.5–5.1)
Sodium: 136 mmol/L (ref 135–145)

## 2021-09-23 LAB — URINALYSIS, ROUTINE W REFLEX MICROSCOPIC
Bilirubin Urine: NEGATIVE
Glucose, UA: NEGATIVE mg/dL
Hgb urine dipstick: NEGATIVE
Ketones, ur: NEGATIVE mg/dL
Leukocytes,Ua: NEGATIVE
Nitrite: NEGATIVE
Protein, ur: NEGATIVE mg/dL
Specific Gravity, Urine: 1.008 (ref 1.005–1.030)
pH: 6 (ref 5.0–8.0)

## 2021-09-23 LAB — CBC
HCT: 38.5 % (ref 36.0–46.0)
Hemoglobin: 13.1 g/dL (ref 12.0–15.0)
MCH: 29.8 pg (ref 26.0–34.0)
MCHC: 34 g/dL (ref 30.0–36.0)
MCV: 87.7 fL (ref 80.0–100.0)
Platelets: 365 10*3/uL (ref 150–400)
RBC: 4.39 MIL/uL (ref 3.87–5.11)
RDW: 14.2 % (ref 11.5–15.5)
WBC: 5.4 10*3/uL (ref 4.0–10.5)
nRBC: 0 % (ref 0.0–0.2)

## 2021-09-23 LAB — RESP PANEL BY RT-PCR (FLU A&B, COVID) ARPGX2
Influenza A by PCR: NEGATIVE
Influenza B by PCR: NEGATIVE
SARS Coronavirus 2 by RT PCR: NEGATIVE

## 2021-09-23 LAB — POC URINE PREG, ED: Preg Test, Ur: NEGATIVE

## 2021-09-23 MED ORDER — MORPHINE SULFATE (PF) 2 MG/ML IV SOLN: 2.0000 mg | INTRAVENOUS | Status: DC | PRN

## 2021-09-23 MED ORDER — ACETAMINOPHEN 650 MG RE SUPP: 650.0000 mg | Freq: Four times a day (QID) | RECTAL | Status: DC | PRN

## 2021-09-23 MED ORDER — ACETAMINOPHEN 325 MG PO TABS
650.0000 mg | ORAL_TABLET | Freq: Four times a day (QID) | ORAL | Status: DC | PRN
  Administered 2021-09-23 – 2021-09-25 (×2): 650 mg via ORAL
  Filled 2021-09-23 (×2): qty 2

## 2021-09-23 MED ORDER — LIDOCAINE 5 % EX PTCH
1.0000 | MEDICATED_PATCH | CUTANEOUS | Status: DC
  Administered 2021-09-23 – 2021-09-24 (×2): 1 via TRANSDERMAL
  Filled 2021-09-23 (×3): qty 1

## 2021-09-23 MED ORDER — GADOBUTROL 1 MMOL/ML IV SOLN
10.0000 mL | Freq: Once | INTRAVENOUS | Status: AC | PRN
  Administered 2021-09-23: 10 mL via INTRAVENOUS
  Filled 2021-09-23: qty 10

## 2021-09-23 MED ORDER — POLYETHYLENE GLYCOL 3350 17 G PO PACK: 17.0000 g | PACK | Freq: Every day | ORAL | Status: DC | PRN

## 2021-09-23 MED ORDER — MORPHINE SULFATE (PF) 4 MG/ML IV SOLN
4.0000 mg | Freq: Once | INTRAVENOUS | Status: AC
  Administered 2021-09-23: 4 mg via INTRAVENOUS
  Filled 2021-09-23: qty 1

## 2021-09-23 MED ORDER — ONDANSETRON HCL 4 MG PO TABS: 4.0000 mg | ORAL_TABLET | Freq: Four times a day (QID) | ORAL | Status: DC | PRN

## 2021-09-23 MED ORDER — LORAZEPAM 2 MG/ML IJ SOLN: 1.0000 mg | INTRAMUSCULAR | Status: DC | PRN

## 2021-09-23 MED ORDER — ONDANSETRON HCL 4 MG/2ML IJ SOLN: 4.0000 mg | Freq: Four times a day (QID) | INTRAMUSCULAR | Status: DC | PRN

## 2021-09-23 MED ORDER — OXYCODONE HCL 5 MG PO TABS: 5.0000 mg | ORAL_TABLET | ORAL | Status: DC | PRN

## 2021-09-23 NOTE — Plan of Care (Signed)
  Problem: Education: Goal: Knowledge of General Education information will improve Description: Including pain rating scale, medication(s)/side effects and non-pharmacologic comfort measures Outcome: Progressing Note: Patient profile completed. Family is at the bedside. Complaining of a headache with movement. Skin intact.

## 2021-09-23 NOTE — ED Triage Notes (Signed)
Pt comes with c/o tingling to left side. Pt denies any CP. Pt denies any headache, dizziness and blurry vision.  Pt states some back pain that radiates down her back to leg.

## 2021-09-23 NOTE — ED Provider Notes (Signed)
[facility] Emergency Department Provider Note   ____________________________________________   Event Date/Time   First MD Initiated Contact with Patient 09/23/21 [phone]     (approximate)  I have reviewed the triage vital signs and the nursing notes.   HISTORY  Chief Complaint Back Pain and Numbness    HPI Dominique Estes is a 50 y.o. female with no significant past medical history presents to the ED complaining of back pain and numbness.  Patient reports that last night she developed a numbness and tingling feeling affecting the left side of her face, her left arm, and her left leg.  She describes it as a pins-and-needles feeling that is painful for her.  She has not noticed any associated vision changes, speech changes, facial droop, or extremity weakness.  Symptoms have been constant since last night and are not exacerbated or alleviated by anything.  When she woke up this morning, she noticed pain in her left lower back, denies any recent trauma to her back.  She has not had any saddle anesthesia, urinary retention or incontinence.  She denies any weakness in her legs and has been walking without difficulty.  She denies any issues with her back in the past.        Past Medical History:  Diagnosis Date   Carpal tunnel syndrome of right wrist 2004   History of kidney stones    age15    Patient Active Problem List   Diagnosis Date Noted   Obesity BMI=42.5 04/09/2021   Smoker 1/2-1 ppd 04/09/2021   Uterine bleeding 04/09/2021    Past Surgical History:  Procedure Laterality Date   carpel tunnel release Right 2004   COLPOSCOPY  03/23/2013   TOTAL LAPAROSCOPIC HYSTERECTOMY WITH SALPINGECTOMY Bilateral 06/25/2021   Procedure: TOTAL LAPAROSCOPIC HYSTERECTOMY WITH SALPINGECTOMY, CYSTOSCOPY;  Surgeon: [name], MD;  Location: [facility] ORS;  Service: Gynecology;  Laterality: Bilateral;    Prior to Admission medications   Medication Sig Start Date End  Date Taking? Authorizing Provider  ascorbic acid (VITAMIN C) 1000 MG tablet Take 1,000 mg by mouth daily.    [provider]  docusate sodium (COLACE) 100 MG capsule Take 1 capsule (100 mg total) by mouth 2 (two) times daily. To keep stools soft 06/25/21   [name], MD  gabapentin (NEURONTIN) 800 MG tablet Take 1 tablet (800 mg total) by mouth at bedtime for 14 days. Take nightly for 3 days, then up to 14 days as needed 06/25/21 07/09/21  [name], MD  ondansetron (ZOFRAN ODT) 4 MG disintegrating tablet Take 1 tablet (4 mg total) by mouth every 6 (six) hours as needed for nausea. 06/25/21   [name], MD  oxyCODONE (OXY IR/ROXICODONE) 5 MG immediate release tablet Take 1 tablet (5 mg total) by mouth every 4 (four) hours as needed for severe pain. 06/25/21   [name], MD  vitamin B-12 (CYANOCOBALAMIN) 500 MCG tablet Take 500 mcg by mouth daily.    [provider]  Vitamin D, Ergocalciferol, (DRISDOL) 1.25 MG (50000 UNIT) CAPS capsule Take 50,000 Units by mouth once a week. monday 07/08/20   [provider]    Allergies Chocolate flavor, Peanut oil, and Tranexamic acid  Family History  Problem Relation Age of Onset   Diabetes Maternal Grandmother    Fibroids Mother    Hypertension Mother    Diabetes Brother    Anxiety disorder Daughter    Breast cancer Neg Hx     Social History Social History  Tobacco Use   Smoking status: Every Day    Packs/day: 0.50    Types: Cigarettes   Smokeless tobacco: Never  Vaping Use   Vaping Use: Never used  Substance Use Topics   Alcohol use: Yes    Comment: about a glass of wine every or every other day   Drug use: Yes    Types: Marijuana    Comment: none since 1997    Review of Systems  Constitutional: No fever/chills Eyes: No visual changes. ENT: No sore throat. Cardiovascular: Denies chest pain. Respiratory: Denies shortness of breath. Gastrointestinal: No abdominal pain.  No nausea, no  vomiting.  No diarrhea.  No constipation. Genitourinary: Negative for dysuria. Musculoskeletal: Positive for back pain. Skin: Negative for rash. Neurological: Negative for headaches or focal weakness, positive for left-sided numbness and tingling.  ____________________________________________   PHYSICAL EXAM:  VITAL SIGNS: ED Triage Vitals  Enc Vitals Group     BP 09/23/21 0745 (!) 144/104     Pulse Rate 09/23/21 0745 83     Resp 09/23/21 0745 17     Temp 09/23/21 0745 98.9 F (37.2 C)     Temp Source 09/23/21 0745 Oral     SpO2 09/23/21 0745 98 %     Weight --      Height --      Head Circumference --      Peak Flow --      Pain Score 09/23/21 0739 10     Pain Loc --      Pain Edu? --      Excl. in [place]? --     Constitutional: Alert and oriented. Eyes: Conjunctivae are normal. Head: Atraumatic. Nose: No congestion/rhinnorhea. Mouth/Throat: Mucous membranes are moist. Neck: Normal ROM Cardiovascular: Normal rate, regular rhythm. Grossly normal heart sounds.  2+ radial pulses bilaterally. Respiratory: Normal respiratory effort.  No retractions. Lungs CTAB. Gastrointestinal: Soft and nontender. No distention. Genitourinary: deferred Musculoskeletal: No lower extremity tenderness nor edema. Neurologic:  Normal speech and language.  Subjective decreased sensation of left face, left arm, and left leg with 5 out of 5 strength in bilateral upper and lower extremities.  No facial droop noted, cranial nerves II through XII are grossly intact. Skin:  Skin is warm, dry and intact. No rash noted. Psychiatric: Mood and affect are normal. Speech and behavior are normal.  ____________________________________________   LABS (all labs ordered are listed, but only abnormal results are displayed)  Labs Reviewed  BASIC METABOLIC PANEL - Abnormal; Notable for the following components:      Result Value   Glucose, Bld 112 (*)    Calcium 8.7 (*)    All other components within normal  limits  URINALYSIS, ROUTINE W REFLEX MICROSCOPIC - Abnormal; Notable for the following components:   Color, Urine YELLOW (*)    APPearance CLOUDY (*)    All other components within normal limits  RESP PANEL BY RT-PCR (FLU A&B, COVID) ARPGX2  CBC  CBG MONITORING, ED  POC URINE PREG, ED   ____________________________________________  EKG  ED ECG REPORT I, Blake Divine, the attending physician, personally viewed and interpreted this ECG.   Date: 09/23/2021  EKG Time: 7:38  Rate: 84  Rhythm: normal sinus rhythm  Axis: Normal  Intervals: Incomplete RBBB  ST&T Change: None    PROCEDURES  Procedure(s) performed (including Critical Care):  Procedures   ____________________________________________   INITIAL IMPRESSION / ASSESSMENT AND PLAN / ED COURSE      50 year old female with no significant  past medical history presents to the ED with left-sided numbness and tingling since last night, now with pain in her left lower back present since she woke up this morning.  Symptoms seem unlikely to represent stroke given painful numbness and tingling with no strength deficit.  She is outside the window for tPA and symptoms are not consistent with a large vessel occlusion, no indication for code stroke.  Symptoms also do not appear consistent with a cauda equina syndrome.  EKG shows no evidence of arrhythmia or ischemia and labs are unremarkable.  CT head was performed and shows possible Chiari malformation.  Imaging reviewed with Dr. Quinn Axe of neurology, who agrees this is unlikely to explain patient's symptoms but she will require further assessment with MRI with and without contrast.  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  Findings reviewed with Dr. Quinn Axe of neurology, who recommends admission for MRI imaging of C, T, and L-spine along with neurology consultation tomorrow.  Case discussed with hospitalist for admission.       ____________________________________________   FINAL CLINICAL IMPRESSION(S) / ED DIAGNOSES  Final diagnoses:  Left sided numbness  Acute left-sided low back pain with left-sided sciatica     ED Discharge Orders     None        Note:  This document was prepared using Dragon voice recognition software and may include unintentional dictation errors.    Blake Divine, MD 09/23/21 [phone]

## 2021-09-23 NOTE — ED Provider Notes (Signed)
Emergency Medicine Provider Triage Evaluation Note  Dominique Estes , a 50 y.o. female  was evaluated in triage.  Pt complains of numbness to the left side of her face, left arm, and left leg since last night.  Since waking up this morning, she has been dealing with pain in her left lower back that radiates down her left leg.  She denies any weakness in her arm or leg, has not had any vision or speech changes.  She has been ambulating without difficulty.  Review of Systems  Positive: Positive for left-sided numbness and tingling, back pain. Negative: Left-sided weakness, speech changes, vision changes, incontinence, saddle anesthesia.  Physical Exam  BP (!) 144/104 (BP Location: Right Arm)   Pulse 83   Temp 98.9 F (37.2 C) (Oral)   Resp 17   SpO2 98%  Gen:   Awake, no distress  Resp:  Normal effort MSK:   Moves extremities without difficulty, tenderness to palpation noted at left lumbar paraspinal area. Other:  5 out of 5 strength in bilateral upper and lower extremities, no facial droop or slurred speech noted.  Medical Decision Making  Medically screening exam initiated at 7:48 AM.  Appropriate orders placed.  AHRI OLSON was informed that the remainder of the evaluation will be completed by another provider, this initial triage assessment does not replace that evaluation, and the importance of remaining in the ED until their evaluation is complete.    Blake Divine, MD 09/23/21 [phone]

## 2021-09-23 NOTE — ED Notes (Signed)
See triage note. Pt to ED c/o L facial, arm and leg numbness since last night, after lying on L side. Then woke up this morning with continued L sided numbness and also burning and tingling sensation in L arm and leg.   Pt states that this morning when holding coffee cup, she felt she was going to drop coffee mug and had to switch hands (mild weakness).

## 2021-09-23 NOTE — ED Notes (Signed)
Pt on phone with MRI screener. 

## 2021-09-23 NOTE — H&P (Signed)
H&P:    Dominique Estes   ZWC:[id number] DOB: Apr 09, 1971 DOA: 09/23/2021  PCP: Tracie Harrier, MD  Chief Complaint: Left-sided numbness and tingling   History of Present Illness:    HPI: Dominique Estes is a 50 y.o. female with no significant past medical history presents to the emergency department with left-sided numbness and tingling that started yesterday. No slurred speech, or facial droop. Described a transient episode of left hand weakness where she had difficulty holding a coffee mug.   In the ED she is resting comfortably.  Eating her dinner.  She has 5 out of 5 muscle strength in all 4 extremities.  No slurred speech or facial droop.  Prior history of the same.  She denies any chest pain, shortness of breath, nausea vomiting or diarrhea.  No urinary complaints  ED Course: Lab work including BMP, CBC and urinalysis are unremarkable.  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  ED physician spoke with Dr. Quinn Axe who recommended admission with neurology consult.  She will see the patient in the morning.  She also recommended obtaining MRI with and without contrast of the cervical, thoracic and lumbar spine.     ROS:   14 point review of systems is negative except for what is mentioned above in the HPI.   Past Medical History:   Past Medical History:  Diagnosis Date   Carpal tunnel syndrome of right wrist 2004   History of kidney stones    age15    Past Surgical History:   Past Surgical History:  Procedure Laterality Date   carpel tunnel release Right 2004   COLPOSCOPY  03/23/2013   TOTAL LAPAROSCOPIC HYSTERECTOMY WITH SALPINGECTOMY Bilateral 06/25/2021   Procedure: TOTAL LAPAROSCOPIC HYSTERECTOMY WITH SALPINGECTOMY, CYSTOSCOPY;  Surgeon: [name], MD;  Location: [facility] ORS;  Service: Gynecology;   Laterality: Bilateral;    Social History:   Social History   Socioeconomic History   Marital status: Single    Spouse name: Not on file   Number of children: 3   Years of education: Not on file   Highest education level: Not on file  Occupational History   Not on file  Tobacco Use   Smoking status: Every Day    Packs/day: 0.50    Types: Cigarettes   Smokeless tobacco: Never  Vaping Use   Vaping Use: Never used  Substance and Sexual Activity   Alcohol use: Yes    Comment: about a glass of wine every or every other day   Drug use: Yes    Types: Marijuana    Comment: none since 1997   Sexual activity: Yes    Partners: Male    Birth control/protection: I.U.D.  Other Topics Concern   Not on file  Social History Narrative   Not on file   Social Determinants of Health   Financial Resource Strain: Not on file  Food Insecurity: Not on file  Transportation Needs: Not on file  Physical Activity: Not on file  Stress: Not on file  Social Connections: Not on file  Intimate Partner Violence: Not At Risk   Fear of Current or Ex-Partner: No   Emotionally Abused: No   Physically Abused: No   Sexually Abused: No    Allergies:   Allergies  Allergen Reactions   Chocolate Flavor Anaphylaxis   Peanut Oil Itching,  Shortness Of Breath and Swelling   Tranexamic Acid     Other reaction(s): Muscle Pain    Family History:   Family History  Problem Relation Age of Onset   Diabetes Maternal Grandmother    Fibroids Mother    Hypertension Mother    Diabetes Brother    Anxiety disorder Daughter    Breast cancer Neg Hx      Current Medications:   Prior to Admission medications   Medication Sig Start Date End Date Taking? Authorizing Provider  ascorbic acid (VITAMIN C) 1000 MG tablet Take 1,000 mg by mouth daily.    [provider]  docusate sodium (COLACE) 100 MG capsule Take 1 capsule (100 mg total) by mouth 2 (two) times daily. To keep stools soft 06/25/21    [name], MD  gabapentin (NEURONTIN) 800 MG tablet Take 1 tablet (800 mg total) by mouth at bedtime for 14 days. Take nightly for 3 days, then up to 14 days as needed 06/25/21 07/09/21  [name], MD  ondansetron (ZOFRAN ODT) 4 MG disintegrating tablet Take 1 tablet (4 mg total) by mouth every 6 (six) hours as needed for nausea. 06/25/21   [name], MD  oxyCODONE (OXY IR/ROXICODONE) 5 MG immediate release tablet Take 1 tablet (5 mg total) by mouth every 4 (four) hours as needed for severe pain. 06/25/21   [name], MD  vitamin B-12 (CYANOCOBALAMIN) 500 MCG tablet Take 500 mcg by mouth daily.    [provider]  Vitamin D, Ergocalciferol, (DRISDOL) 1.25 MG (50000 UNIT) CAPS capsule Take 50,000 Units by mouth once a week. monday 07/08/20   [provider]     Physical Exam:   Vitals:   09/23/21 0745 09/23/21 1000 09/23/21 1321  BP: (!) 144/104 113/70 107/60  Pulse: 83 66 60  Resp: 17 16 20   Temp: 98.9 F (37.2 C)  98.6 F (37 C)  TempSrc: Oral  Oral  SpO2: 98% 100% 100%     General:  Appears calm and comfortable and is in NAD Cardiovascular:  RRR, no m/r/g.  Respiratory:   CTA bilaterally with no wheezes/rales/rhonchi.  Normal respiratory effort. Abdomen:  soft, NT, ND, NABS Skin:  no rash or induration seen on limited exam Musculoskeletal:  grossly normal tone BUE/BLE, good ROM, no bony abnormality Lower extremity:  No LE edema.  Limited foot exam with no ulcerations.  2+ distal pulses. Psychiatric:  grossly normal mood and affect, speech fluent and appropriate, AOx3 Neurologic:  CN 2-12 grossly intact, moves all extremities in coordinated fashion, sensation intact    Data Review:    Radiological Exams on Admission: Independently reviewed - see discussion in A/P where applicable  CT Head Wo Contrast  Result Date: 09/23/2021 CLINICAL DATA:  Neuro deficit, acute, stroke suspected EXAM: CT HEAD WITHOUT CONTRAST TECHNIQUE: Contiguous  axial images were obtained from the base of the skull through the vertex without intravenous contrast. COMPARISON:  CT head 05/18/2018. FINDINGS: Brain: No evidence of acute infarction, hemorrhage, hydrocephalus, extra-axial collection or mass lesion/mass effect. Partially empty and expanded sella. The cerebellar tonsils extend below the foramen magnum with suspected crowding, which is likely chronic. The degree of inferior extent is difficult to quantify by CT, particularly given streak artifact through this region. Vascular: No hyperdense vessel identified. Skull: No acute fracture. Sinuses/Orbits: Small right maxillary sinus retention cyst. Otherwise, largely clear sinuses. No acute orbital findings. Other: No mastoid effusions. IMPRESSION: 1. The cerebellar tonsils extend below the foramen magnum with suspected crowding.  The degree of inferior extent is difficult to quantify by CT, particularly given streak artifact through this region. Recommend MRI of the head to further characterize given the potential for a Chiari malformation (or other cause of inferior cerebellar tonsillar descent). 2. No evidence of acute large vascular territory infarct or acute hemorrhage. 3. Partially empty and expanded sella, which is often a normal anatomic variant but can be associated with idiopathic intracranial hypertension. Electronically Signed   By: Margaretha Sheffield M.D.   On: 09/23/2021 08:37   MR Brain W and Wo Contrast  Result Date: 09/23/2021 CLINICAL DATA:  Provided history: Neuro deficit, acute, stroke suspected. Additional history provided: Patient reports numbness in left side of face, left arm and left leg. EXAM: MRI HEAD WITHOUT AND WITH CONTRAST TECHNIQUE: Multiplanar, multiecho pulse sequences of the brain and surrounding structures were obtained without and with intravenous contrast. CONTRAST:  77mL GADAVIST GADOBUTROL 1 MMOL/ML IV SOLN COMPARISON:  Head CT examinations 09/23/2021 and earlier. FINDINGS:  Brain: Cerebral volume is normal. Partially empty sella turcica. 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 Associated crowding at the level of the foramen magnum and craniocervical junction. There are a few small scattered foci of T2 FLAIR hyperintense signal abnormality within the bilateral frontal lobe white matter (measuring up to 3 mm), nonspecific. There is no acute infarct. No evidence of an intracranial mass. No chronic intracranial blood products. No extra-axial fluid collection. No midline shift. No pathologic intracranial enhancement identified. Vascular: Maintained flow voids within the proximal large arterial vessels. Skull and upper cervical spine: No focal suspicious marrow lesion. Orbits/Paranasal sinuses: Visualized orbits show no acute finding. Mild mucosal thickening within the bilateral ethmoid sinuses. Trace mucosal thickening within the bilateral maxillary sinuses at the imaged levels. Superimposed small right maxillary sinus mucous retention cyst. Other: Trace fluid within the right mastoid air cells. IMPRESSION: No evidence of acute or recent subacute infarction. 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 Alternatively, cerebellar tonsillar ectopia may be seen in the setting of idiopathic intracranial hypertension (pseudotumor cerebri) or intracranial hypotension. Associated crowding at the level of the foramen magnum and craniocervical junction. Partially empty sella turcica. While this finding often reflects incidental anatomic variation, it can also be associated with idiopathic intracranial hypertension (pseudotumor cerebri). There are a few small (measuring up to 3 mm) nonspecific T2 FLAIR hyperintense remote insults within the bilateral frontal lobe white matter. Mild paranasal  sinus disease at the imaged levels, as described. Trace fluid within the right mastoid air cells. Electronically Signed   By: Kellie Simmering D.O.   On: 09/23/2021 15:18    EKG: Independently reviewed.  NSR with rate 84   Labs on Admission: I have personally reviewed the available labs and imaging studies at the time of the admission.  Pertinent labs on Admission: None     Assessment/Plan:    Chiari I malformation?: Neurology will be consulted.  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 Start neurochecks. The ED provider did get in touch with the neurologist on-call Dr. Quinn Axe and she recommended admission and they will see the patient in consultation in the morning.  She also recommended further imaging with MRI with and without contrast of the cervical, thoracic and lumbar spine.  Left-sided numbness and tingling:  Plan as above   Other information:    Level of Care: MedSurg DVT prophylaxis: SCDs Code Status: Full code Consults: Neurology Admission status: Observation   Leslee Home DO [facility]   How to contact the [facility] Attending or Consulting provider [place] or covering provider during after hours [place], for this patient?  Check the care team in [facility] and look for a) attending/consulting [facility] provider listed and b) the [facility] team listed Log into [email or web address] and use [place]'s universal password to access. If you do not have the password, please contact the hospital operator. Locate the [facility] provider you are looking for under [facility] and page to a number that you can be directly reached. If you still have difficulty reaching the provider, please page the [facility] (Director on Call) for the Hospitalists listed on amion for assistance.   09/23/2021, 4:41 PM

## 2021-09-24 ENCOUNTER — Observation Stay: Payer: BC Managed Care – PPO

## 2021-09-24 DIAGNOSIS — G935 Compression of brain: Secondary | ICD-10-CM | POA: Diagnosis not present

## 2021-09-24 LAB — HIV ANTIBODY (ROUTINE TESTING W REFLEX): HIV Screen 4th Generation wRfx: NONREACTIVE

## 2021-09-24 MED ORDER — GADOBUTROL 1 MMOL/ML IV SOLN
10.0000 mL | Freq: Once | INTRAVENOUS | Status: AC | PRN
  Administered 2021-09-24: 10 mL via INTRAVENOUS

## 2021-09-24 NOTE — Consult Note (Signed)
NEUROLOGY CONSULTATION NOTE   Date of service: September 24, 2021 Patient Name: Dominique Estes MRN:  [id number] DOB:  [birth year] Reason for consult: chiari I malformation, L sided paresthesias Requesting physician: Dr. 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  She also reported some pain from her cervical spine extending into her left arm and also lower back pain extending into her left leg.  She has not had the symptoms to this degree before.  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  She was admitted for further spine imaging given her new onset neurologic symptoms and noted Chiari I malformation. She does not report headache.    ROS   Per HPI: all other systems reviewed and are negative  Past History   I have reviewed the following:  Past Medical History:  Diagnosis Date   Carpal tunnel syndrome of right wrist 2004   History of kidney stones    age15   Past Surgical History:  Procedure Laterality Date   carpel tunnel release Right 2004   COLPOSCOPY  03/23/2013   TOTAL LAPAROSCOPIC HYSTERECTOMY WITH SALPINGECTOMY Bilateral 06/25/2021   Procedure: TOTAL LAPAROSCOPIC HYSTERECTOMY WITH SALPINGECTOMY, CYSTOSCOPY;  Surgeon: [name], MD;  Location: [facility] ORS;  Service: Gynecology;  Laterality: Bilateral;   Family History  Problem Relation Age of Onset   Diabetes Maternal Grandmother    Fibroids Mother    Hypertension Mother    Diabetes Brother    Anxiety disorder Daughter    Breast cancer Neg Hx    Social History   Socioeconomic History   Marital  status: Single    Spouse name: Not on file   Number of children: 3   Years of education: Not on file   Highest education level: Not on file  Occupational History   Not on file  Tobacco Use   Smoking status: Every Day    Packs/day: 0.50    Types: Cigarettes   Smokeless tobacco: Never  Vaping Use   Vaping Use: Never used  Substance and Sexual Activity   Alcohol use: Yes    Comment: about a glass of wine every or every other day   Drug use: Yes    Types: Marijuana    Comment: none since 1997   Sexual activity: Yes    Partners: Male    Birth control/protection: I.U.D.  Other Topics Concern   Not on file  Social History Narrative   Not on file   Social Determinants of Health   Financial Resource Strain: Not on file  Food Insecurity: Not on file  Transportation Needs: Not on file  Physical Activity: Not on file  Stress: Not on file  Social Connections: Not on file   Allergies  Allergen Reactions   Chocolate Flavor Anaphylaxis   Peanut Oil Itching, Shortness Of Breath and Swelling   Tranexamic Acid     Other reaction(s): Muscle Pain    Medications   Medications Prior to Admission  Medication Sig Dispense Refill Last Dose  ascorbic acid (VITAMIN C) 1000 MG tablet Take 1,000 mg by mouth daily.   09/23/2021 at am   vitamin B-12 (CYANOCOBALAMIN) 500 MCG tablet Take 500 mcg by mouth daily.   09/23/2021 at am   Vitamin D, Ergocalciferol, (DRISDOL) 1.25 MG (50000 UNIT) CAPS capsule Take 50,000 Units by mouth every Monday. monday   09/22/2021 at unk   docusate sodium (COLACE) 100 MG capsule Take 1 capsule (100 mg total) by mouth 2 (two) times daily. To keep stools soft (Patient not taking: Reported on 09/23/2021) 30 capsule 0 Not Taking   gabapentin (NEURONTIN) 800 MG tablet Take 1 tablet (800 mg total) by mouth at bedtime for 14 days. Take nightly for 3 days, then up to 14 days as needed (Patient not taking: Reported on 09/23/2021) 14 tablet 0 Not Taking   ondansetron (ZOFRAN ODT)  4 MG disintegrating tablet Take 1 tablet (4 mg total) by mouth every 6 (six) hours as needed for nausea. (Patient not taking: Reported on 09/23/2021) 20 tablet 0 Completed Course   oxyCODONE (OXY IR/ROXICODONE) 5 MG immediate release tablet Take 1 tablet (5 mg total) by mouth every 4 (four) hours as needed for severe pain. (Patient not taking: Reported on 09/23/2021) 20 tablet 0 Completed Course   phentermine (ADIPEX-P) 37.5 MG tablet Take 37.5 mg by mouth every morning. (Patient not taking: Reported on 09/23/2021)   Not Taking      Current Facility-Administered Medications:    acetaminophen (TYLENOL) tablet 650 mg, 650 mg, Oral, Q6H PRN, 650 mg at 09/23/21 2135 **OR** acetaminophen (TYLENOL) suppository 650 mg, 650 mg, Rectal, Q6H PRN, Anwar, Shayan S, DO   lidocaine (LIDODERM) 5 % 1 patch, 1 patch, Transdermal, Q24H, Blake Divine, MD, 1 patch at 09/24/21 1043   LORazepam (ATIVAN) injection 1 mg, 1 mg, Intravenous, Q4H PRN, Blake Divine, MD   morphine 2 MG/ML injection 2 mg, 2 mg, Intravenous, Q2H PRN, Anwar, Shayan S, DO   ondansetron (ZOFRAN) tablet 4 mg, 4 mg, Oral, Q6H PRN **OR** ondansetron (ZOFRAN) injection 4 mg, 4 mg, Intravenous, Q6H PRN, Rowe Pavy, Shayan S, DO   oxyCODONE (Oxy IR/ROXICODONE) immediate release tablet 5 mg, 5 mg, Oral, Q4H PRN, Anwar, Shayan S, DO   polyethylene glycol (MIRALAX / GLYCOLAX) packet 17 g, 17 g, Oral, Daily PRN, Imagene Sheller S, DO  Vitals   Vitals:   09/23/21 2319 09/24/21 0703 09/24/21 1230 09/24/21 1600  BP: 116/72 115/73 114/74 130/82  Pulse: 67 65 73 91  Resp: 17 16 18 18   Temp: 98.2 F (36.8 C) 98.2 F (36.8 C) 98.8 F (37.1 C) 97.8 F (36.6 C)  TempSrc: Oral Oral Oral Oral  SpO2: 100% 98% 100% 100%  Weight:      Height:         Body mass index is 41.6 kg/m.  Physical Exam   Physical Exam Gen: A&O x4, NAD HEENT: Atraumatic, normocephalic;mucous membranes moist; oropharynx clear, tongue without atrophy or fasciculations. Neck:  Supple, trachea midline. Resp: CTAB, no w/r/r CV: RRR, no m/g/r; nml S1 and S2. 2+ symmetric peripheral pulses. Abd: soft/NT/ND; nabs x 4 quad Extrem: Nml bulk; no cyanosis, clubbing, or edema.  Neuro: *MS: A&O x4. Follows multi-step commands.  *Speech: fluid, nondysarthric, able to name and repeat *CN:    I: Deferred   II,III: PERRLA, VFF by confrontation, optic discs unable to be visualized 2/2 pupillary constriction   III,IV,VI: EOMI w/o nystagmus, no ptosis   V: Sensation impaired V2 on L   VII: Eyelid closure  was full.  Smile symmetric.   VIII: Hearing intact to voice   IX,X: Voice normal, palate elevates symmetrically    XI: SCM/trap 5/5 bilat   XII: Tongue protrudes midline, no atrophy or fasciculations   *Motor:   Normal bulk.  No tremor, rigidity or bradykinesia. No pronator drift. *Sensory: Decreased to LT LUE/LLE *Coordination:  Finger-to-nose, heel-to-shin, rapid alternating motions were intact. *Reflexes:  2+ and symmetric throughout without clonus; toes down-going bilat *Gait: deferred   Labs   CBC:  Recent Labs  Lab 09/23/21 0742  WBC 5.4  HGB 13.1  HCT 38.5  MCV 87.7  PLT 660    Basic Metabolic Panel:  Lab Results  Component Value Date   NA 136 09/23/2021   K 3.7 09/23/2021   CO2 24 09/23/2021   GLUCOSE 112 (H) 09/23/2021   BUN 10 09/23/2021   CREATININE 0.75 09/23/2021   CALCIUM 8.7 (L) 09/23/2021   GFRNONAA >60 09/23/2021   GFRAA >60 05/18/2018   Lipid Panel: No results found for: LDLCALC HgbA1c: No results found for: HGBA1C Urine Drug Screen: No results found for: LABOPIA, COCAINSCRNUR, LABBENZ, AMPHETMU, THCU, LABBARB  Alcohol Level No results found for: ETH   Impression   This is a 50 year old woman with a 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  MRI brain revealed no e/o acute infarct but did reveal chiari I  malformation. She does not report headache.  Recommendations   - MRI c/t/l spine wwo contrast - If above imaging is unremarkable including no e/o myelopathy or syringomyelia, she may f/u with outpatient neurology  Will f/u imaging results ______________________________________________________________________   Thank you for the opportunity to take part in the care of this patient. If you have any further questions, please contact the neurology consultation attending.  Signed,  Su Monks, MD [facility] [phone]  If 7pm- 7am, please page neurology on call as listed in [place].

## 2021-09-24 NOTE — TOC Initial Note (Signed)
Transition of Care [facility]) - Initial/Assessment Note    Patient Details  Name: Dominique Estes MRN: [id number] Date of Birth: 09/08/1971  Transition of [facility]) CM/SW Contact:    Kerin Salen, RN Phone Number: 09/24/2021, 1:42 PM  Clinical Narrative:  TOCRN spoke with patient daughter at bedside. Patient indicated that she lives with adult kids, no assisted devices, drives herself to medical visits and pick up medications at the [facility] on [place]. Independent with ADL's. PCP Dr. Tye Savoy, [facility]. Ready to go home, waiting on Neurology and MRI. No TOC barriers assessed will continue to track for discharge needs.                 Expected Discharge Plan: Home/Self Care Barriers to Discharge: Continued Medical Work up   Patient Goals and CMS Choice Patient states their goals for this hospitalization and ongoing recovery are:: To return home.   Choice offered to / list presented to : NA  Expected Discharge Plan and Services Expected Discharge Plan: Home/Self Care       Living arrangements for the past 2 months: Single Family Home                                      Prior Living Arrangements/Services Living arrangements for the past 2 months: Single Family Home Lives with:: Adult Children Patient language and need for interpreter reviewed:: Yes Do you feel safe going back to the place where you live?: Yes      Need for Family Participation in Patient Care: No (Comment) Care giver support system in place?: Yes (comment)   Criminal Activity/Legal Involvement Pertinent to Current Situation/Hospitalization: No - Comment as needed  Activities of Daily Living Home Assistive Devices/Equipment: None ADL Screening (condition at time of admission) Patient's cognitive ability adequate to safely complete daily activities?: Yes Is the patient deaf or have difficulty hearing?: No Does the patient have difficulty seeing, even when wearing glasses/contacts?:  No Does the patient have difficulty concentrating, remembering, or making decisions?: No Patient able to express need for assistance with ADLs?: Yes Does the patient have difficulty dressing or bathing?: No Independently performs ADLs?: Yes (appropriate for developmental age) Does the patient have difficulty walking or climbing stairs?: No Weakness of Legs: Left Weakness of Arms/Hands: None  Permission Sought/Granted Permission sought to share information with : Case Manager                Emotional Assessment Appearance:: Appears stated age Attitude/Demeanor/Rapport: Engaged Affect (typically observed): Accepting Orientation: : Oriented to Self, Oriented to Place, Oriented to  Time, Oriented to Situation Alcohol / Substance Use: Not Applicable Psych Involvement: No (comment)  Admission diagnosis:  Chiari I malformation ([place]) [G93.5] Left sided numbness [R20.0] Acute left-sided low back pain with left-sided sciatica [M54.42] Patient Active Problem List   Diagnosis Date Noted   Chiari I malformation ([place]) 09/23/2021   Obesity BMI=42.5 04/09/2021   Smoker 1/2-1 ppd 04/09/2021   Uterine bleeding 04/09/2021   PCP:  Tracie Harrier, MD Pharmacy:   [facility] [address]), [place] [address] [place]) [place] [address] Phone: [phone] Fax: [phone]     Social Determinants of Health (SDOH) Interventions    Readmission Risk Interventions No flowsheet data found.

## 2021-09-24 NOTE — Progress Notes (Signed)
PROGRESS NOTE    Dominique SHACKETT  PNP:[id number] DOB: 30-Jan-1971 DOA: 09/23/2021 PCP: Tracie Harrier, MD    Brief Narrative:  Dominique Estes is a 50 y.o. female with no significant past medical history presents to the emergency department with left-sided numbness and tingling that started yesterday. No slurred speech, or facial droop. Described a transient episode of left hand weakness where she had difficulty holding a coffee mug.    12/7 feels LUE/LLE tingling today , otherwise no other complaints. No weakness    Consultants:  neurology  Procedures:   Antimicrobials:      Subjective: No HA no dizziness  Objective: Vitals:   09/23/21 1321 09/23/21 2121 09/23/21 2319 09/24/21 0703  BP: 107/60 127/66 116/72 115/73  Pulse: 60 65 67 65  Resp: 20 19 17 16   Temp: 98.6 F (37 C) 98.4 F (36.9 C) 98.2 F (36.8 C) 98.2 F (36.8 C)  TempSrc: Oral Oral Oral Oral  SpO2: 100% 100% 100% 98%  Weight:  113.4 kg    Height:  5\' 5"  (1.651 m)     No intake or output data in the 24 hours ending 09/24/21 0836 Filed Weights   09/23/21 2121  Weight: 113.4 kg    Examination:  General exam: Appears calm and comfortable  Respiratory system: Clear to auscultation. Respiratory effort normal. Cardiovascular system: S1 & S2 heard, RRR. No JVD, murmurs, rubs, gallops or clicks.  Gastrointestinal system: Abdomen is nondistended, soft and nontender. . Normal bowel sounds heard. Central nervous system: Alert and oriented. Grossly intact. 5/5 x4 Extremities:no edema Psychiatry: Judgement and insight appear normal. Mood & affect appropriate.     Data Reviewed: I have personally reviewed following labs and imaging studies  CBC: Recent Labs  Lab 09/23/21 0742  WBC 5.4  HGB 13.1  HCT 38.5  MCV 87.7  PLT 173   Basic Metabolic Panel: Recent Labs  Lab 09/23/21 0742  NA 136  K 3.7  CL 104  CO2 24  GLUCOSE 112*  BUN 10  CREATININE 0.75  CALCIUM 8.7*   GFR: Estimated  Creatinine Clearance: 108.1 mL/min (by C-G formula based on SCr of 0.75 mg/dL). Liver Function Tests: No results for input(s): AST, ALT, ALKPHOS, BILITOT, PROT, ALBUMIN in the last 168 hours. No results for input(s): LIPASE, AMYLASE in the last 168 hours. No results for input(s): AMMONIA in the last 168 hours. Coagulation Profile: No results for input(s): INR, PROTIME in the last 168 hours. Cardiac Enzymes: No results for input(s): CKTOTAL, CKMB, CKMBINDEX, TROPONINI in the last 168 hours. BNP (last 3 results) No results for input(s): PROBNP in the last 8760 hours. HbA1C: No results for input(s): HGBA1C in the last 72 hours. CBG: No results for input(s): GLUCAP in the last 168 hours. Lipid Profile: No results for input(s): CHOL, HDL, LDLCALC, TRIG, CHOLHDL, LDLDIRECT in the last 72 hours. Thyroid Function Tests: No results for input(s): TSH, T4TOTAL, FREET4, T3FREE, THYROIDAB in the last 72 hours. Anemia Panel: No results for input(s): VITAMINB12, FOLATE, FERRITIN, TIBC, IRON, RETICCTPCT in the last 72 hours. Sepsis Labs: No results for input(s): PROCALCITON, LATICACIDVEN in the last 168 hours.  Recent Results (from the past 240 hour(s))  Resp Panel by RT-PCR (Flu A&B, Covid) Nasopharyngeal Swab     Status: None   Collection Time: 09/23/21  4:54 PM   Specimen: Nasopharyngeal Swab; Nasopharyngeal(NP) swabs in vial transport medium  Result Value Ref Range Status   SARS Coronavirus 2 by RT PCR NEGATIVE NEGATIVE Final  Comment: (NOTE) SARS-CoV-2 target nucleic acids are NOT DETECTED.  The SARS-CoV-2 RNA is generally detectable in upper respiratory specimens during the acute phase of infection. The lowest concentration of SARS-CoV-2 viral copies this assay can detect is 138 copies/mL. A negative result does not preclude SARS-Cov-2 infection and should not be used as the sole basis for treatment or other patient management decisions. A negative result may occur with  improper  specimen collection/handling, submission of specimen other than nasopharyngeal swab, presence of viral mutation(s) within the areas targeted by this assay, and inadequate number of viral copies(<138 copies/mL). A negative result must be combined with clinical observations, patient history, and epidemiological information. The expected result is Negative.  Fact Sheet for Patients:  EntrepreneurPulse.com.au  Fact Sheet for Healthcare Providers:  IncredibleEmployment.be  This test is no t yet approved or cleared by the [place] FDA and  has been authorized for detection and/or diagnosis of SARS-CoV-2 by FDA under an Emergency Use Authorization (EUA). This EUA will remain  in effect (meaning this test can be used) for the duration of the COVID-19 declaration under Section 564(b)(1) of the Act, 21 U.S.C.section 360bbb-3(b)(1), unless the authorization is terminated  or revoked sooner.       Influenza A by PCR NEGATIVE NEGATIVE Final   Influenza B by PCR NEGATIVE NEGATIVE Final    Comment: (NOTE) The Xpert Xpress SARS-CoV-2/FLU/RSV plus assay is intended as an aid in the diagnosis of influenza from Nasopharyngeal swab specimens and should not be used as a sole basis for treatment. Nasal washings and aspirates are unacceptable for Xpert Xpress SARS-CoV-2/FLU/RSV testing.  Fact Sheet for Patients: EntrepreneurPulse.com.au  Fact Sheet for Healthcare Providers: IncredibleEmployment.be  This test is not yet approved or cleared by the [place] FDA and has been authorized for detection and/or diagnosis of SARS-CoV-2 by FDA under an Emergency Use Authorization (EUA). This EUA will remain in effect (meaning this test can be used) for the duration of the COVID-19 declaration under Section 564(b)(1) of the Act, 21 U.S.C. section 360bbb-3(b)(1), unless the authorization is terminated or revoked.  Performed at  [facility], [address] [place], [place] [address]          Radiology Studies: CT Head Wo Contrast  Result Date: 09/23/2021 CLINICAL DATA:  Neuro deficit, acute, stroke suspected EXAM: CT HEAD WITHOUT CONTRAST TECHNIQUE: Contiguous axial images were obtained from the base of the skull through the vertex without intravenous contrast. COMPARISON:  CT head 05/18/2018. FINDINGS: Brain: No evidence of acute infarction, hemorrhage, hydrocephalus, extra-axial collection or mass lesion/mass effect. Partially empty and expanded sella. The cerebellar tonsils extend below the foramen magnum with suspected crowding, which is likely chronic. The degree of inferior extent is difficult to quantify by CT, particularly given streak artifact through this region. Vascular: No hyperdense vessel identified. Skull: No acute fracture. Sinuses/Orbits: Small right maxillary sinus retention cyst. Otherwise, largely clear sinuses. No acute orbital findings. Other: No mastoid effusions. IMPRESSION: 1. The cerebellar tonsils extend below the foramen magnum with suspected crowding. The degree of inferior extent is difficult to quantify by CT, particularly given streak artifact through this region. Recommend MRI of the head to further characterize given the potential for a Chiari malformation (or other cause of inferior cerebellar tonsillar descent). 2. No evidence of acute large vascular territory infarct or acute hemorrhage. 3. Partially empty and expanded sella, which is often a normal anatomic variant but can be associated with idiopathic intracranial hypertension. Electronically Signed   By: Roslynn Amble  Ronnald Ramp M.D.   On: 09/23/2021 08:37   MR Brain W and Wo Contrast  Result Date: 09/23/2021 CLINICAL DATA:  Provided history: Neuro deficit, acute, stroke suspected. Additional history provided: Patient reports numbness in left side of face, left arm and left leg. EXAM: MRI HEAD WITHOUT AND WITH CONTRAST TECHNIQUE:  Multiplanar, multiecho pulse sequences of the brain and surrounding structures were obtained without and with intravenous contrast. CONTRAST:  6mL GADAVIST GADOBUTROL 1 MMOL/ML IV SOLN COMPARISON:  Head CT examinations 09/23/2021 and earlier. FINDINGS: Brain: Cerebral volume is normal. Partially empty sella turcica. 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 Associated crowding at the level of the foramen magnum and craniocervical junction. There are a few small scattered foci of T2 FLAIR hyperintense signal abnormality within the bilateral frontal lobe white matter (measuring up to 3 mm), nonspecific. There is no acute infarct. No evidence of an intracranial mass. No chronic intracranial blood products. No extra-axial fluid collection. No midline shift. No pathologic intracranial enhancement identified. Vascular: Maintained flow voids within the proximal large arterial vessels. Skull and upper cervical spine: No focal suspicious marrow lesion. Orbits/Paranasal sinuses: Visualized orbits show no acute finding. Mild mucosal thickening within the bilateral ethmoid sinuses. Trace mucosal thickening within the bilateral maxillary sinuses at the imaged levels. Superimposed small right maxillary sinus mucous retention cyst. Other: Trace fluid within the right mastoid air cells. IMPRESSION: No evidence of acute or recent subacute infarction. 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 Alternatively, cerebellar tonsillar ectopia may be seen in the setting of idiopathic intracranial hypertension (pseudotumor cerebri) or intracranial hypotension. Associated crowding at the level of the foramen magnum and craniocervical junction. Partially empty sella turcica. While this finding often reflects incidental anatomic  variation, it can also be associated with idiopathic intracranial hypertension (pseudotumor cerebri). There are a few small (measuring up to 3 mm) nonspecific T2 FLAIR hyperintense remote insults within the bilateral frontal lobe white matter. Mild paranasal sinus disease at the imaged levels, as described. Trace fluid within the right mastoid air cells. Electronically Signed   By: Kellie Simmering D.O.   On: 09/23/2021 15:18        Scheduled Meds:  lidocaine  1 patch Transdermal Q24H   Continuous Infusions:  Assessment & Plan:   Principal Problem:   Chiari I malformation (HCC)   Chiari I malformation With Lt sided tingling. No weakness CT no acute evidence of large vascular territory infarct or acute hemorrhage.  Please see full report MRI of the brain obtained please see full result Neurology's input pending, will follow up  DVT prophylaxis: SCD Code Status: Full Family Communication: None at bedside Disposition Plan: Back home Status is: Observation  The patient remains OBS appropriate and will d/c before 2 midnights.  Neurology input pending           LOS: 0 days   Time spent: 35 minutes with more than 50% on [place], MD [facility] Pager [phone]  If 7PM-7AM, please contact night-coverage 09/24/2021, 8:36 AM

## 2021-09-24 NOTE — Progress Notes (Signed)
After speaking with radiology staff, I explained to the patient and daughter the rationale behind having to wait for MRI with contrast.(MRI yesterday at 1600 with contrast-must wait 24 hours before using contrast again-spine MRI will take about 2 hours, therefore they can not squeeze her in any earlier than 2030)  Pt has agreed to stay for the MRI at 2030 as long as she can be discharged home tonight even if it is late.  Made Angie RN aware, she will discuss with MD.

## 2021-09-25 ENCOUNTER — Other Ambulatory Visit: Payer: Self-pay | Admitting: Neurology

## 2021-09-25 DIAGNOSIS — G935 Compression of brain: Secondary | ICD-10-CM | POA: Diagnosis not present

## 2021-09-25 NOTE — Progress Notes (Signed)
Pt wish to speak with MD concerning D?C planning. MD made aware.

## 2021-09-25 NOTE — Progress Notes (Signed)
Neurology progress note  S: No changes in subjective L sided paresthesias. No new neurologic complaints today.   Interval data:  MRI c spine wwo contrast:  1. 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 2. Central disc protrusion at C4-5 with mild central canal stenosis and bilateral foraminal narrowing. 3. Right paramedian disc protrusion at C6-7 with mild central canal stenosis but no abnormal cord signal or foraminal narrowing. 4. Mild disc bulging at C5-6 without significant stenosis. 5. Mild facet hypertrophy bilaterally at C7-T1 without significant disc protrusion or stenosis.  MRI t spine wwo contrast: WNL  MRI L spine wwo contrast:  1. Mild left foraminal narrowing at L3-4 secondary to a leftward disc bulging and facet hypertrophy. 2. Mild disc bulging and bilateral facet hypertrophy at L4-5 without significant stenosis. 3. Mild right facet hypertrophy at L5-S1 without significant disc protrusion or stenosis.  CNS imaging personally reviewed; I agree with above interpretations  O:  Vitals:   09/25/21 0500 09/25/21 0744  BP: 112/66 121/71  Pulse: 69 70  Resp: 17 18  Temp:  98.3 F (36.8 C)  SpO2: 100% 99%   Physical Exam Gen: A&O x4, NAD HEENT: Atraumatic, normocephalic;mucous membranes moist; oropharynx clear, tongue without atrophy or fasciculations. Neck: Supple, trachea midline. Resp: CTAB, no w/r/r CV: RRR, no m/g/r; nml S1 and S2. 2+ symmetric peripheral pulses. Abd: soft/NT/ND; nabs x 4 quad Extrem: Nml bulk; no cyanosis, clubbing, or edema.   Neuro: *MS: A&O x4. Follows multi-step commands.  *Speech: fluid, nondysarthric, able to name and repeat *CN:    I: Deferred   II,III: PERRLA, VFF by confrontation, optic discs unable to be visualized 2/2 pupillary constriction   III,IV,VI: EOMI w/o nystagmus, no ptosis   V: Sensation impaired V2 on L   VII: Eyelid closure was full.  Smile symmetric.   VIII: Hearing intact to voice   IX,X: Voice  normal, palate elevates symmetrically    XI: SCM/trap 5/5 bilat   XII: Tongue protrudes midline, no atrophy or fasciculations    *Motor:   Normal bulk.  No tremor, rigidity or bradykinesia. No pronator drift. *Sensory: Decreased to LT LUE/LLE *Coordination:  Finger-to-nose, heel-to-shin, rapid alternating motions were intact. 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 MRI c/t/l spine revealed no associated abnl ie no myelopathy, no syrinx. Patient is asymptomatic from chiari I malformation except occasional mild pressure in her head when she coughs. I will refer her for follow up in neurology clinic. No further inpatient neurologic workup indicated at this time.  Su Monks, MD [facility] [phone]  If 7pm- 7am, please page neurology on call as listed in [place].

## 2021-09-25 NOTE — Discharge Summary (Addendum)
Dominique Estes PXT:[id number] DOB: 1971-02-25 DOA: 09/23/2021  PCP: Tracie Harrier, MD  Admit date: 09/23/2021 Discharge date:09/25/21  Admitted From: Home Disposition: Home  Recommendations for Outpatient Follow-up:  Follow up with PCP in 1 week Please obtain BMP/CBC in one week Please follow up with Dr. Manuella Ghazi neurology in 1 week     Discharge Condition:Stable CODE STATUS: Full Diet recommendation: Heart Healthy  Brief/Interim Summary: Per OEV:[id number] Dominique Estes is a 50 y.o. female with no significant past medical history presents to the emergency department with left-sided numbness and tingling that started yesterday. No slurred speech, or facial droop. Described a transient episode of left hand weakness where she had difficulty holding a coffee mug.    In the ED she is resting comfortably.  Eating her dinner.  She has 5 out of 5 muscle strength in all 4 extremities.  No slurred speech or facial droop.  Prior history of the same.  She denies any chest pain, shortness of breath, nausea vomiting or diarrhea.  No urinary complaints   ED Course: Lab work including BMP, CBC and urinalysis are unremarkable.  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   Neurology was consulted.  Patient underwent MRI c/t/l spine revealed no associated abnl ie no myelopathy, no syrinx. Patient is asymptomatic from chiari I malformation except occasional mild pressure in her head when she coughs.  Neurology felt patient is stable to be discharged and follow-up with neurology clinic as outpatient    Discharge Diagnoses:  Principal Problem:   Chiari I malformation [facility])    Discharge Instructions  Discharge Instructions     Call MD for:  persistant dizziness or light-headedness   Complete by: As directed    Call MD for:  persistant nausea and  vomiting   Complete by: As directed    Diet - low sodium heart healthy   Complete by: As directed    Discharge instructions   Complete by: As directed    Follow up with dr. Manuella Ghazi neurology next week   Increase activity slowly   Complete by: As directed       Allergies as of 09/25/2021       Reactions   Chocolate Flavor Anaphylaxis   Peanut Oil Itching, Shortness Of Breath, Swelling   Tranexamic Acid    Other reaction(s): Muscle Pain        Medication List     STOP taking these medications    ondansetron 4 MG disintegrating tablet Commonly known as: Zofran ODT   oxyCODONE 5 MG immediate release tablet Commonly known as: Oxy IR/ROXICODONE   phentermine 37.5 MG tablet Commonly known as: ADIPEX-P       TAKE these medications    ascorbic acid 1000 MG tablet Commonly known as: VITAMIN C Take 1,000 mg by mouth daily.   docusate sodium 100 MG capsule Commonly known as: COLACE Take 1 capsule (100 mg total) by mouth 2 (two) times daily. To keep stools soft   gabapentin 800 MG tablet Commonly known as: Neurontin Take 1 tablet (800 mg total) by mouth at bedtime for 14 days. Take nightly for 3 days, then up to 14 days as needed   vitamin B-12 500 MCG tablet Commonly known as: CYANOCOBALAMIN Take 500 mcg by mouth daily.   Vitamin D (Ergocalciferol) 1.25 MG (50000 UNIT) Caps capsule Commonly known as: DRISDOL Take  50,000 Units by mouth every Monday. monday        Follow-up Information     Vladimir Crofts, MD Follow up in 1 week(s).   Specialty: Neurology Why: Patient wants to make own follow up appt Contact information: [place] [facility] [place] [address] [phone]         Tracie Harrier, MD Follow up in 1 week(s).   Specialty: Internal Medicine Why: Patient wants to make own follow up appt Contact information: [place] [address] [phone]                 Allergies  Allergen Reactions   Chocolate Flavor Anaphylaxis   Peanut Oil Itching, Shortness Of Breath and Swelling   Tranexamic Acid     Other reaction(s): Muscle Pain    Consultations: Neurology   Procedures/Studies: CT Head Wo Contrast  Result Date: 09/23/2021 CLINICAL DATA:  Neuro deficit, acute, stroke suspected EXAM: CT HEAD WITHOUT CONTRAST TECHNIQUE: Contiguous axial images were obtained from the base of the skull through the vertex without intravenous contrast. COMPARISON:  CT head 05/18/2018. FINDINGS: Brain: No evidence of acute infarction, hemorrhage, hydrocephalus, extra-axial collection or mass lesion/mass effect. Partially empty and expanded sella. The cerebellar tonsils extend below the foramen magnum with suspected crowding, which is likely chronic. The degree of inferior extent is difficult to quantify by CT, particularly given streak artifact through this region. Vascular: No hyperdense vessel identified. Skull: No acute fracture. Sinuses/Orbits: Small right maxillary sinus retention cyst. Otherwise, largely clear sinuses. No acute orbital findings. Other: No mastoid effusions. IMPRESSION: 1. The cerebellar tonsils extend below the foramen magnum with suspected crowding. The degree of inferior extent is difficult to quantify by CT, particularly given streak artifact through this region. Recommend MRI of the head to further characterize given the potential for a Chiari malformation (or other cause of inferior cerebellar tonsillar descent). 2. No evidence of acute large vascular territory infarct or acute hemorrhage. 3. Partially empty and expanded sella, which is often a normal anatomic variant but can be associated with idiopathic intracranial hypertension. Electronically Signed   By: Margaretha Sheffield M.D.   On: 09/23/2021 08:37   MR Brain W and Wo Contrast  Result Date: 09/23/2021 CLINICAL DATA:  Provided history: Neuro deficit, acute, stroke suspected. Additional  history provided: Patient reports numbness in left side of face, left arm and left leg. EXAM: MRI HEAD WITHOUT AND WITH CONTRAST TECHNIQUE: Multiplanar, multiecho pulse sequences of the brain and surrounding structures were obtained without and with intravenous contrast. CONTRAST:  51mL GADAVIST GADOBUTROL 1 MMOL/ML IV SOLN COMPARISON:  Head CT examinations 09/23/2021 and earlier. FINDINGS: Brain: Cerebral volume is normal. Partially empty sella turcica. 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 Associated crowding at the level of the foramen magnum and craniocervical junction. There are a few small scattered foci of T2 FLAIR hyperintense signal abnormality within the bilateral frontal lobe white matter (measuring up to 3 mm), nonspecific. There is no acute infarct. No evidence of an intracranial mass. No chronic intracranial blood products. No extra-axial fluid collection. No midline shift. No pathologic intracranial enhancement identified. Vascular: Maintained flow voids within the proximal large arterial vessels. Skull and upper cervical spine: No focal suspicious marrow lesion. Orbits/Paranasal sinuses: Visualized orbits show no acute finding. Mild mucosal thickening within the bilateral ethmoid sinuses. Trace mucosal thickening within the bilateral maxillary sinuses at  the imaged levels. Superimposed small right maxillary sinus mucous retention cyst. Other: Trace fluid within the right mastoid air cells. IMPRESSION: No evidence of acute or recent subacute infarction. 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 Alternatively, cerebellar tonsillar ectopia may be seen in the setting of idiopathic intracranial hypertension (pseudotumor cerebri) or intracranial hypotension. Associated crowding at the level of  the foramen magnum and craniocervical junction. Partially empty sella turcica. While this finding often reflects incidental anatomic variation, it can also be associated with idiopathic intracranial hypertension (pseudotumor cerebri). There are a few small (measuring up to 3 mm) nonspecific T2 FLAIR hyperintense remote insults within the bilateral frontal lobe white matter. Mild paranasal sinus disease at the imaged levels, as described. Trace fluid within the right mastoid air cells. Electronically Signed   By: Kellie Simmering D.O.   On: 09/23/2021 15:18   MR Cervical Spine W or Wo Contrast  Result Date: 09/24/2021 CLINICAL DATA:  Myelopathy, acute or progressive. Chiari malformation. EXAM: MRI CERVICAL SPINE WITHOUT AND WITH CONTRAST TECHNIQUE: Multiplanar and multiecho pulse sequences of the cervical spine, to include the craniocervical junction and cervicothoracic junction, were obtained without and with intravenous contrast. CONTRAST:  24mL GADAVIST GADOBUTROL 1 MMOL/ML IV SOLN COMPARISON:  MR head without and with contrast 09/23/2021 FINDINGS: Alignment: This no significant listhesis is present. Straightening of the normal cervical lordosis is present. Vertebrae: Fatty endplate marrow changes are noted along the inferior anterior endplates of C5 and C6. Marrow signal and vertebral body heights are otherwise normal. Cord: Cervical spinal cord is within normal limits the skull base through T3-4. No syrinx is present. Posterior Fossa, vertebral arteries, paraspinal tissues: The cerebellar tonsils extend 8 mm below the foramen magnum in the midline and are somewhat pointed. There is crowding of the foramen magnum. Disc levels: C2-3: Negative. C3-4: Negative. C4-5: A central disc protrusion effaces the ventral CSF and potentially contacts the cord. Mild foraminal narrowing is present bilaterally. C5-6: Mild disc bulging is present without significant stenosis. C6-7: A right paramedian disc protrusion effaces the  ventral CSF and likely contacts the cord. No abnormal cord signal is present. Foramina are patent. C7-T1: Mild facet hypertrophy is present bilaterally. No significant disc protrusion or stenosis is present. IMPRESSION: 1. 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 2. Central disc protrusion at C4-5 with mild central canal stenosis and bilateral foraminal narrowing. 3. Right paramedian disc protrusion at C6-7 with mild central canal stenosis but no abnormal cord signal or foraminal narrowing. 4. Mild disc bulging at C5-6 without significant stenosis. 5. Mild facet hypertrophy bilaterally at C7-T1 without significant disc protrusion or stenosis. Electronically Signed   By: San Morelle M.D.   On: 09/24/2021 19:06   MR THORACIC SPINE W WO CONTRAST  Result Date: 09/24/2021 CLINICAL DATA:  Myelopathy. Numbness and tingling in the left face and extremities. EXAM: MRI THORACIC WITHOUT AND WITH CONTRAST TECHNIQUE: Multiplanar and multiecho pulse sequences of the thoracic spine were obtained without and with intravenous contrast. CONTRAST:  40mL GADAVIST GADOBUTROL 1 MMOL/ML IV SOLN COMPARISON:  None. FINDINGS: Alignment: No significant listhesis is present in the thoracic spine. Thoracic kyphosis is within normal limits. Vertebrae: Marrow signal and vertebral body heights are normal. Cord:  Normal signal and morphology. Paraspinal and other soft tissues: The paraspinous soft tissues are within normal limits. Visualized lung fields are clear. 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 Disc levels: No significant thoracic disc protrusion or stenosis is present. Foramina are patent bilaterally throughout the thoracic spine. Postcontrast images demonstrate no pathologic enhancement. IMPRESSION: Negative MRI of the thoracic spine. No acute or focal lesion to explain the patient's symptoms. Electronically Signed   By: San Morelle M.D.   On: 09/24/2021 19:19   MR Lumbar Spine W  Wo Contrast  Result Date: 09/24/2021 CLINICAL DATA:  Myelopathy, acute or progressive. Numbness in the left face and extremities. Chiari malformation. EXAM: MRI LUMBAR SPINE WITHOUT AND WITH CONTRAST TECHNIQUE: Multiplanar and multiecho pulse sequences of the lumbar spine were obtained without and with intravenous contrast. CONTRAST:  2mL GADAVIST GADOBUTROL 1 MMOL/ML IV SOLN COMPARISON:  None. FINDINGS: Segmentation: 5 non rib-bearing lumbar type vertebral bodies are present. The lowest fully formed vertebral body is L5. Alignment: No significant listhesis is present. Lumbar lordosis preserved. Vertebrae:  Marrow signal and vertebral body heights are normal. Conus medullaris and cauda equina: Conus extends to the T12-L1 level. Conus and cauda equina appear normal. Paraspinal and other soft tissues: Limited imaging the abdomen is unremarkable. There is no significant adenopathy. No solid organ lesions are present. Disc levels: L1-2: Negative. L2-3: Negative. L3-4: A broad-based is asymmetric to the left. Facet hypertrophy is worse on the left. There is some scratched at the central canal is patent. Mild left foraminal narrowing is present. L4-5: Mild disc bulging and bilateral facet hypertrophy is present. No significant stenosis is present. L5-S1: Mild facet hypertrophy is worse on the right. No significant disc protrusion or stenosis is present. No pathologic enhancement is present. IMPRESSION: 1. Mild left foraminal narrowing at L3-4 secondary to a leftward disc bulging and facet hypertrophy. 2. Mild disc bulging and bilateral facet hypertrophy at L4-5 without significant stenosis. 3. Mild right facet hypertrophy at L5-S1 without significant disc protrusion or stenosis. Electronically Signed   By: San Morelle M.D.   On: 09/24/2021 19:20      Subjective: No chest pain, no headache this AM, no shortness of breath or dizziness.   No neurologic symptoms other than her left tingling.  Discharge  Exam: Vitals:   09/25/21 0500 09/25/21 0744  BP: 112/66 121/71  Pulse: 69 70  Resp: 17 18  Temp:  98.3 F (36.8 C)  SpO2: 100% 99%   Vitals:   09/24/21 2116 09/25/21 0108 09/25/21 0500 09/25/21 0744  BP: (!) 143/79 115/67 112/66 121/71  Pulse: 79 74 69 70  Resp: 17 17 17 18   Temp: 98.8 F (37.1 C) 98.4 F (36.9 C)  98.3 F (36.8 C)  TempSrc: Oral Oral  Oral  SpO2: 98% 100% 100% 99%  Weight:      Height:        General: Pt is alert, awake, not in acute distress Cardiovascular: RRR, S1/S2 +, no rubs, no gallops Respiratory: CTA bilaterally, no wheezing, no rhonchi Abdominal: Soft, NT, ND, bowel sounds + Extremities: no edema, no cyanosis    The results of significant diagnostics from this hospitalization (including imaging, microbiology, ancillary and laboratory) are listed below for reference.     Microbiology: Recent Results (from the past 240 hour(s))  Resp Panel by RT-PCR (Flu A&B, Covid) Nasopharyngeal Swab     Status: None   Collection Time: 09/23/21  4:54 PM   Specimen: Nasopharyngeal Swab; Nasopharyngeal(NP) swabs in vial transport medium  Result Value Ref Range Status   SARS Coronavirus 2 by RT PCR NEGATIVE NEGATIVE Final    Comment: (NOTE) SARS-CoV-2  target nucleic acids are NOT DETECTED.  The SARS-CoV-2 RNA is generally detectable in upper respiratory specimens during the acute phase of infection. The lowest concentration of SARS-CoV-2 viral copies this assay can detect is 138 copies/mL. A negative result does not preclude SARS-Cov-2 infection and should not be used as the sole basis for treatment or other patient management decisions. A negative result may occur with  improper specimen collection/handling, submission of specimen other than nasopharyngeal swab, presence of viral mutation(s) within the areas targeted by this assay, and inadequate number of viral copies(<138 copies/mL). A negative result must be combined with clinical observations,  patient history, and epidemiological information. The expected result is Negative.  Fact Sheet for Patients:  EntrepreneurPulse.com.au  Fact Sheet for Healthcare Providers:  IncredibleEmployment.be  This test is no t yet approved or cleared by the [place] FDA and  has been authorized for detection and/or diagnosis of SARS-CoV-2 by FDA under an Emergency Use Authorization (EUA). This EUA will remain  in effect (meaning this test can be used) for the duration of the COVID-19 declaration under Section 564(b)(1) of the Act, 21 U.S.C.section 360bbb-3(b)(1), unless the authorization is terminated  or revoked sooner.       Influenza A by PCR NEGATIVE NEGATIVE Final   Influenza B by PCR NEGATIVE NEGATIVE Final    Comment: (NOTE) The Xpert Xpress SARS-CoV-2/FLU/RSV plus assay is intended as an aid in the diagnosis of influenza from Nasopharyngeal swab specimens and should not be used as a sole basis for treatment. Nasal washings and aspirates are unacceptable for Xpert Xpress SARS-CoV-2/FLU/RSV testing.  Fact Sheet for Patients: EntrepreneurPulse.com.au  Fact Sheet for Healthcare Providers: IncredibleEmployment.be  This test is not yet approved or cleared by the [place] FDA and has been authorized for detection and/or diagnosis of SARS-CoV-2 by FDA under an Emergency Use Authorization (EUA). This EUA will remain in effect (meaning this test can be used) for the duration of the COVID-19 declaration under Section 564(b)(1) of the Act, 21 U.S.C. section 360bbb-3(b)(1), unless the authorization is terminated or revoked.  Performed at [facility], [address] [place], [place] [address]      Labs: BNP (last 3 results) No results for input(s): BNP in the last 8760 hours. Basic Metabolic Panel: Recent Labs  Lab 09/23/21 0742  NA 136  K 3.7  CL 104  CO2 24  GLUCOSE 112*  BUN 10   CREATININE 0.75  CALCIUM 8.7*   Liver Function Tests: No results for input(s): AST, ALT, ALKPHOS, BILITOT, PROT, ALBUMIN in the last 168 hours. No results for input(s): LIPASE, AMYLASE in the last 168 hours. No results for input(s): AMMONIA in the last 168 hours. CBC: Recent Labs  Lab 09/23/21 0742  WBC 5.4  HGB 13.1  HCT 38.5  MCV 87.7  PLT 365   Cardiac Enzymes: No results for input(s): CKTOTAL, CKMB, CKMBINDEX, TROPONINI in the last 168 hours. BNP: Invalid input(s): POCBNP CBG: No results for input(s): GLUCAP in the last 168 hours. D-Dimer No results for input(s): DDIMER in the last 72 hours. Hgb A1c No results for input(s): HGBA1C in the last 72 hours. Lipid Profile No results for input(s): CHOL, HDL, LDLCALC, TRIG, CHOLHDL, LDLDIRECT in the last 72 hours. Thyroid function studies No results for input(s): TSH, T4TOTAL, T3FREE, THYROIDAB in the last 72 hours.  Invalid input(s): FREET3 Anemia work up No results for input(s): VITAMINB12, FOLATE, FERRITIN, TIBC, IRON, RETICCTPCT in the last 72 hours. Urinalysis    Component Value Date/Time  COLORURINE YELLOW (A) 09/23/2021 0919   APPEARANCEUR CLOUDY (A) 09/23/2021 0919   LABSPEC 1.008 09/23/2021 0919   PHURINE 6.0 09/23/2021 0919   GLUCOSEU NEGATIVE 09/23/2021 0919   HGBUR NEGATIVE 09/23/2021 0919   BILIRUBINUR NEGATIVE 09/23/2021 0919   KETONESUR NEGATIVE 09/23/2021 0919   PROTEINUR NEGATIVE 09/23/2021 0919   NITRITE NEGATIVE 09/23/2021 0919   LEUKOCYTESUR NEGATIVE 09/23/2021 0919   Sepsis Labs Invalid input(s): PROCALCITONIN,  WBC,  LACTICIDVEN Microbiology Recent Results (from the past 240 hour(s))  Resp Panel by RT-PCR (Flu A&B, Covid) Nasopharyngeal Swab     Status: None   Collection Time: 09/23/21  4:54 PM   Specimen: Nasopharyngeal Swab; Nasopharyngeal(NP) swabs in vial transport medium  Result Value Ref Range Status   SARS Coronavirus 2 by RT PCR NEGATIVE NEGATIVE Final    Comment:  (NOTE) SARS-CoV-2 target nucleic acids are NOT DETECTED.  The SARS-CoV-2 RNA is generally detectable in upper respiratory specimens during the acute phase of infection. The lowest concentration of SARS-CoV-2 viral copies this assay can detect is 138 copies/mL. A negative result does not preclude SARS-Cov-2 infection and should not be used as the sole basis for treatment or other patient management decisions. A negative result may occur with  improper specimen collection/handling, submission of specimen other than nasopharyngeal swab, presence of viral mutation(s) within the areas targeted by this assay, and inadequate number of viral copies(<138 copies/mL). A negative result must be combined with clinical observations, patient history, and epidemiological information. The expected result is Negative.  Fact Sheet for Patients:  EntrepreneurPulse.com.au  Fact Sheet for Healthcare Providers:  IncredibleEmployment.be  This test is no t yet approved or cleared by the [place] FDA and  has been authorized for detection and/or diagnosis of SARS-CoV-2 by FDA under an Emergency Use Authorization (EUA). This EUA will remain  in effect (meaning this test can be used) for the duration of the COVID-19 declaration under Section 564(b)(1) of the Act, 21 U.S.C.section 360bbb-3(b)(1), unless the authorization is terminated  or revoked sooner.       Influenza A by PCR NEGATIVE NEGATIVE Final   Influenza B by PCR NEGATIVE NEGATIVE Final    Comment: (NOTE) The Xpert Xpress SARS-CoV-2/FLU/RSV plus assay is intended as an aid in the diagnosis of influenza from Nasopharyngeal swab specimens and should not be used as a sole basis for treatment. Nasal washings and aspirates are unacceptable for Xpert Xpress SARS-CoV-2/FLU/RSV testing.  Fact Sheet for Patients: EntrepreneurPulse.com.au  Fact Sheet for Healthcare  Providers: IncredibleEmployment.be  This test is not yet approved or cleared by the [place] FDA and has been authorized for detection and/or diagnosis of SARS-CoV-2 by FDA under an Emergency Use Authorization (EUA). This EUA will remain in effect (meaning this test can be used) for the duration of the COVID-19 declaration under Section 564(b)(1) of the Act, 21 U.S.C. section 360bbb-3(b)(1), unless the authorization is terminated or revoked.  Performed at [facility], [address] [place], [place] [address]      Time coordinating discharge: Over 30 minutes  SIGNED:   Nolberto Hanlon, MD  [facility] 09/27/2021, 5:39 PM Pager   If 7PM-7AM, please contact night-coverage [email or web address] Password [email or web address]

## 2021-09-25 NOTE — Progress Notes (Signed)
Amb ref neuro placed  Su Monks, MD [facility] [phone]  If 7pm- 7am, please page neurology on call as listed in [place].

## 2021-09-25 NOTE — Progress Notes (Addendum)
Pt report two d/c medication orders or no longer being taken. Pt declines option to reach out to MD to redo d/c instruction states that she will update medications at follow up pt. D/C proceeded as planned. Pt request no wheel chair for transport to vehicle at d/c. Pt alert oriented independent in ambulation.

## 2021-12-16 ENCOUNTER — Other Ambulatory Visit: Payer: Self-pay

## 2021-12-16 ENCOUNTER — Emergency Department: Payer: Managed Care, Other (non HMO)

## 2021-12-16 ENCOUNTER — Emergency Department
Admission: EM | Admit: 2021-12-16 | Discharge: 2021-12-16 | Disposition: A | Discharge status: Home / Self Care | Payer: Managed Care, Other (non HMO) | Attending: Emergency Medicine | Admitting: Emergency Medicine

## 2021-12-16 DIAGNOSIS — R200 Anesthesia of skin: Secondary | ICD-10-CM | POA: Diagnosis not present

## 2021-12-16 DIAGNOSIS — R519 Headache, unspecified: Secondary | ICD-10-CM | POA: Insufficient documentation

## 2021-12-16 DIAGNOSIS — H53149 Visual discomfort, unspecified: Secondary | ICD-10-CM | POA: Insufficient documentation

## 2021-12-16 DIAGNOSIS — R791 Abnormal coagulation profile: Secondary | ICD-10-CM | POA: Insufficient documentation

## 2021-12-16 DIAGNOSIS — R202 Paresthesia of skin: Secondary | ICD-10-CM | POA: Diagnosis not present

## 2021-12-16 LAB — DIFFERENTIAL
Abs Immature Granulocytes: 0.01 10*3/uL (ref 0.00–0.07)
Basophils Absolute: 0.1 10*3/uL (ref 0.0–0.1)
Basophils Relative: 1 %
Eosinophils Absolute: 0.1 10*3/uL (ref 0.0–0.5)
Eosinophils Relative: 2 %
Immature Granulocytes: 0 %
Lymphocytes Relative: 45 %
Lymphs Abs: 2.1 10*3/uL (ref 0.7–4.0)
Monocytes Absolute: 0.4 10*3/uL (ref 0.1–1.0)
Monocytes Relative: 8 %
Neutro Abs: 2 10*3/uL (ref 1.7–7.7)
Neutrophils Relative %: 44 %

## 2021-12-16 LAB — COMPREHENSIVE METABOLIC PANEL
ALT: 14 U/L (ref 0–44)
AST: 14 U/L — ABNORMAL LOW (ref 15–41)
Albumin: 4 g/dL (ref 3.5–5.0)
Alkaline Phosphatase: 39 U/L (ref 38–126)
Anion gap: 7 (ref 5–15)
BUN: 10 mg/dL (ref 6–20)
CO2: 23 mmol/L (ref 22–32)
Calcium: 9 mg/dL (ref 8.9–10.3)
Chloride: 110 mmol/L (ref 98–111)
Creatinine, Ser: 0.8 mg/dL (ref 0.44–1.00)
GFR, Estimated: >60 mL/min (ref >60)
Glucose, Bld: 102 mg/dL — ABNORMAL HIGH (ref 70–99)
Potassium: 3.7 mmol/L (ref 3.5–5.1)
Sodium: 140 mmol/L (ref 135–145)
Total Bilirubin: 1 mg/dL (ref 0.3–1.2)
Total Protein: 7.3 g/dL (ref 6.5–8.1)

## 2021-12-16 LAB — CBC
HCT: 38.8 % (ref 36.0–46.0)
Hemoglobin: 13 g/dL (ref 12.0–15.0)
MCH: 30.2 pg (ref 26.0–34.0)
MCHC: 33.5 g/dL (ref 30.0–36.0)
MCV: 90.2 fL (ref 80.0–100.0)
Platelets: 384 10*3/uL (ref 150–400)
RBC: 4.3 MIL/uL (ref 3.87–5.11)
RDW: 13.7 % (ref 11.5–15.5)
WBC: 4.6 10*3/uL (ref 4.0–10.5)
nRBC: 0 % (ref 0.0–0.2)

## 2021-12-16 LAB — APTT: aPTT: 27 seconds (ref 24–36)

## 2021-12-16 LAB — PROTIME-INR
INR: 1 (ref 0.8–1.2)
Prothrombin Time: 13.1 seconds (ref 11.4–15.2)

## 2021-12-16 MED ORDER — ACETAMINOPHEN 500 MG PO TABS
1000.0000 mg | ORAL_TABLET | Freq: Once | ORAL | Status: AC
  Administered 2021-12-16: 1000 mg via ORAL
  Filled 2021-12-16: qty 2

## 2021-12-16 MED ORDER — SODIUM CHLORIDE 0.9% FLUSH
3.0000 mL | Freq: Once | INTRAVENOUS | Status: AC
  Administered 2021-12-16: 3 mL via INTRAVENOUS

## 2021-12-16 MED ORDER — PROCHLORPERAZINE EDISYLATE 10 MG/2ML IJ SOLN
10.0000 mg | Freq: Once | INTRAMUSCULAR | Status: AC
  Administered 2021-12-16: 10 mg via INTRAVENOUS
  Filled 2021-12-16: qty 2

## 2021-12-16 NOTE — Discharge Instructions (Addendum)
-  Follow-up with your primary care provider as discussed -Treat headaches with Tylenol/ibuprofen at home. -Return to the emergency department anytime if you begin to experience any new or worsening symptoms

## 2021-12-16 NOTE — ED Triage Notes (Addendum)
Pt comes via EMS from work with c/o sever headache. Pt states hx of this. EMs reports neg stroke screen.  [id number]_[id number] BP-148/96 HR-72  Pt now states left arm pain and numbness/tingling.

## 2021-12-16 NOTE — ED Provider Notes (Signed)
[facility] Provider Note    Event Date/Time   First MD Initiated Contact with Patient 12/16/21 1236     (approximate)   History   Chief Complaint Headache   HPI Dominique Estes is a 51 y.o. female, history of Chiari I malformation, obesity, nephrolithiasis, presents to the emergency department for evaluation of severe headache.  Patient states that her headache began earlier this morning.  Describes as a 8/10 pain, predominantly on the left side, associated with numbness along the left side of her face and tingling in her left upper extremity.  Additionally endorses significant photophobia. Patient states that this is happened before, but never this bad.  Patient has a history of Chiari I malformation, and has a surgery scheduled in 1 month.  Denies fever/chills, chest pain, shortness of breath, abdominal pain, numbness/tingling in lower extremities, neck pain, back pain, nausea/vomiting, or LOC.  Denies blood thinner use  History Limitations: No limitations      Physical Exam  Triage Vital Signs: ED Triage Vitals  Enc Vitals Group     BP 12/16/21 1112 115/77     Pulse Rate 12/16/21 1112 71     Resp 12/16/21 1112 18     Temp 12/16/21 1112 98 F (36.7 C)     Temp Source 12/16/21 1112 Oral     SpO2 12/16/21 1112 100 %     Weight --      Height --      Head Circumference --      Peak Flow --      Pain Score 12/16/21 1039 9     Pain Loc --      Pain Edu? --      Excl. in [place]? --     Most recent vital signs: Vitals:   12/16/21 1435 12/16/21 1436  BP: 120/81 120/81  Pulse: (!) 55 (!) 55  Resp: 16 16  Temp: 97.8 F (36.6 C) 97.8 F (36.6 C)  SpO2:  99%    General: Awake, NAD.  CV: Good peripheral perfusion.  Resp: Normal effort.  Lung sounds clear bilaterally. Abd: Soft, non-tender. No distention.  Neuro: At baseline. No gross neurological deficits.   Other: Cranial nerves II through XII intact.  PERRL. EOMI. Patient does endorse  decreased sensation on the left side of her face and left upper extremity, though sensation is still present and has normal 5/5 and range of motion.  No facial droop.  No pronator drift.  Normal cerebellar function.  Patient is able to ambulate without difficulty.  Physical Exam    ED Results / Procedures / Treatments  Labs (all labs ordered are listed, but only abnormal results are displayed) Labs Reviewed  COMPREHENSIVE METABOLIC PANEL - Abnormal; Notable for the following components:      Result Value   Glucose, Bld 102 (*)    AST 14 (*)    All other components within normal limits  PROTIME-INR  APTT  CBC  DIFFERENTIAL  CBG MONITORING, ED     EKG Sinus rhythm, rate 70, no ST segment changes, no axis deviations, normal QRS interval, normal PR interval, artifact present   RADIOLOGY  ED Provider Interpretation: I personally reviewed and interpreted the CT, no evidence of acute intracranial injuries  CT HEAD WO CONTRAST  Result Date: 12/16/2021 CLINICAL DATA:  Headache EXAM: CT HEAD WITHOUT CONTRAST TECHNIQUE: Contiguous axial images were obtained from the base of the skull through the vertex without intravenous contrast. RADIATION DOSE REDUCTION: This  exam was performed according to the departmental dose-optimization program which includes automated exposure control, adjustment of the mA and/or kV according to patient size and/or use of iterative reconstruction technique. COMPARISON:  09/23/2021 FINDINGS: Brain: No evidence of acute infarction, hemorrhage, hydrocephalus, extra-axial collection or mass lesion/mass effect. Vascular: No hyperdense vessel or unexpected calcification. Skull: No osseous abnormality. Sinuses/Orbits: Visualized paranasal sinuses are clear. Visualized mastoid sinuses are clear. Visualized orbits demonstrate no focal abnormality. Other: None IMPRESSION: 1. No acute intracranial pathology. Electronically Signed   By: Kathreen Devoid M.D.   On: 12/16/2021 11:34     PROCEDURES:  Critical Care performed: None.  Procedures    MEDICATIONS ORDERED IN ED: Medications  sodium chloride flush (NS) 0.9 % injection 3 mL (3 mLs Intravenous Given 12/16/21 1236)  prochlorperazine (COMPAZINE) injection 10 mg (10 mg Intravenous Given 12/16/21 1402)  acetaminophen (TYLENOL) tablet 1,000 mg (1,000 mg Oral Given 12/16/21 1401)     IMPRESSION / MDM / ASSESSMENT AND PLAN / ED COURSE  I reviewed the triage vital signs and the nursing notes.                              Differential diagnosis includes, but is not limited to, migraine, epidural/subdural hematoma, Chiari malformation complication, meningitis, tension headache, sinusitis, temporal arteritis, intracranial hypertension, CVA  ED Course Patient appears well.  Vital signs within normal limits.  Notably photophobic and endorsing significant headache.  We will go ahead and treat with acetaminophen and Compazine  CBC shows no evidence of leukocytosis or anemia.  CMP shows no evidence of electrolyte abnormalities, transaminitis, or evidence of kidney injury.  Pro time INR normal  Head CT shows no evidence of acute intracranial pathology.     Assessment/Plan Presentation consistent with migraine, though patient did endorse some concerning neurological symptoms.  However, physical exam reassuring for no debilitating neurological findings.  Negative [place] stroke scale.  Head CT is reassuring for no evidence of acute intracranial abnormalities.  Upon reexamination following treatment, patient states that her headache has decrease significantly and she is no longer feeling any numbness in her face or left upper extremity.  She states that she is ready to be discharged.  Given her work-up, I do not suspect any serious or life-threatening pathology.  We will plan to discharge this patient.  Advised her to follow-up with her primary care provider.   Patient was provided with anticipatory guidance, return  precautions, and educational material. Encouraged the patient to return to the emergency department at any time if they begin to experience any new or worsening symptoms.       FINAL CLINICAL IMPRESSION(S) / ED DIAGNOSES   Final diagnoses:  Bad headache     Rx / DC Orders   ED Discharge Orders     None        Note:  This document was prepared using Dragon voice recognition software and may include unintentional dictation errors.   Teodoro Spray, [place] 12/16/21 1734    Nena Polio, MD 12/17/21 [phone]

## 2021-12-16 NOTE — ED Notes (Addendum)
31 yof c/c of left sided headache and left sided facial and arm numbness since approx. 9 am this morning. The pt advised the [employer] at her job may have caused her headaches.

## 2022-01-06 ENCOUNTER — Other Ambulatory Visit: Payer: Self-pay | Admitting: Internal Medicine

## 2022-01-06 DIAGNOSIS — Z1231 Encounter for screening mammogram for malignant neoplasm of breast: Secondary | ICD-10-CM

## 2022-02-12 ENCOUNTER — Ambulatory Visit
Admission: RE | Admit: 2022-02-12 | Discharge: 2022-02-12 | Disposition: A | Discharge status: Home / Self Care | Payer: Managed Care, Other (non HMO) | Source: Ambulatory Visit | Attending: Internal Medicine | Admitting: Internal Medicine

## 2022-02-12 DIAGNOSIS — Z1231 Encounter for screening mammogram for malignant neoplasm of breast: Secondary | ICD-10-CM | POA: Diagnosis present

## 2022-02-16 ENCOUNTER — Other Ambulatory Visit: Payer: Self-pay | Admitting: Internal Medicine

## 2022-02-16 DIAGNOSIS — N6489 Other specified disorders of breast: Secondary | ICD-10-CM

## 2022-02-16 DIAGNOSIS — R928 Other abnormal and inconclusive findings on diagnostic imaging of breast: Secondary | ICD-10-CM

## 2022-02-26 ENCOUNTER — Ambulatory Visit
Admission: RE | Admit: 2022-02-26 | Discharge: 2022-02-26 | Disposition: A | Discharge status: Home / Self Care | Payer: Managed Care, Other (non HMO) | Source: Ambulatory Visit | Attending: Internal Medicine | Admitting: Internal Medicine

## 2022-02-26 DIAGNOSIS — N6489 Other specified disorders of breast: Secondary | ICD-10-CM

## 2022-02-26 DIAGNOSIS — R928 Other abnormal and inconclusive findings on diagnostic imaging of breast: Secondary | ICD-10-CM | POA: Insufficient documentation

## 2022-03-03 ENCOUNTER — Other Ambulatory Visit: Payer: Managed Care, Other (non HMO)

## 2022-08-19 ENCOUNTER — Other Ambulatory Visit: Payer: Self-pay

## 2022-08-19 ENCOUNTER — Emergency Department: Payer: Managed Care, Other (non HMO)

## 2022-08-19 ENCOUNTER — Emergency Department
Admission: EM | Admit: 2022-08-19 | Discharge: 2022-08-19 | Disposition: A | Discharge status: Home / Self Care | Payer: Managed Care, Other (non HMO) | Attending: Student in an Organized Health Care Education/Training Program | Admitting: Student in an Organized Health Care Education/Training Program

## 2022-08-19 DIAGNOSIS — R519 Headache, unspecified: Secondary | ICD-10-CM | POA: Diagnosis not present

## 2022-08-19 DIAGNOSIS — G43109 Migraine with aura, not intractable, without status migrainosus: Secondary | ICD-10-CM

## 2022-08-19 DIAGNOSIS — R202 Paresthesia of skin: Secondary | ICD-10-CM | POA: Insufficient documentation

## 2022-08-19 MED ORDER — BUTALBITAL-APAP-CAFFEINE 50-325-40 MG PO TABS: 1.0000 | ORAL_TABLET | Freq: Four times a day (QID) | ORAL | 0 refills | Status: AC | PRN

## 2022-08-19 MED ORDER — PROCHLORPERAZINE EDISYLATE 10 MG/2ML IJ SOLN
10.0000 mg | Freq: Once | INTRAMUSCULAR | Status: AC
Start: 2022-08-19 — End: 2022-08-19
  Administered 2022-08-19: 10 mg via INTRAVENOUS
  Filled 2022-08-19: qty 2

## 2022-08-19 MED ORDER — DIPHENHYDRAMINE HCL 50 MG/ML IJ SOLN
12.5000 mg | Freq: Once | INTRAMUSCULAR | Status: AC
  Administered 2022-08-19: 12.5 mg via INTRAVENOUS
  Filled 2022-08-19: qty 1

## 2022-08-19 MED ORDER — ACETAMINOPHEN 500 MG PO TABS
1000.0000 mg | ORAL_TABLET | Freq: Once | ORAL | Status: AC
  Administered 2022-08-19: 1000 mg via ORAL
  Filled 2022-08-19: qty 2

## 2022-08-19 MED ORDER — SUMATRIPTAN SUCCINATE 6 MG/0.5ML ~~LOC~~ SOLN
6.0000 mg | Freq: Once | SUBCUTANEOUS | Status: AC
  Administered 2022-08-19: 6 mg via SUBCUTANEOUS
  Filled 2022-08-19: qty 0.5

## 2022-08-19 NOTE — ED Notes (Signed)
Pt to ED, hx migraine headaches, started with L sided throbbing HA with shooting pain from head down L neck, since 0830 this morning while at work. Pt was exposed to lights at work that triggered this HA. Provider saw pt at bedside.

## 2022-08-19 NOTE — ED Provider Notes (Signed)
-----------------------------------------   4:44 PM on 08/19/2022 ----------------------------------------- Patient care assumed from Dr. Quentin Cornwall.  Patient's MRI has resulted showing no concerning findings.  Patient is feeling better and headache is very much diminished.  Patient still states some light sensitivity which she states typically precedes her migraine type headaches.  We will prescribe the patient Fioricet to be used if needed for any further symptoms.  I discussed with the patient going home getting sleep and avoiding mental stimulation, dark quiet room, etc. patient agreeable to plan of care.  Provided my typical migraine return precautions.   Harvest Dark, MD 08/19/22 1644

## 2022-08-19 NOTE — ED Provider Notes (Signed)
[facility] Provider Note    Event Date/Time   First MD Initiated Contact with Patient 08/19/22 [phone]     (approximate)   History   Headache   HPI  Dominique Estes is a 51 y.o. female with a history of migraines presents to the ER for evaluation of left-sided headache tingling sensation worse with bright lights that started due to exposure to bright light while at work.  This is consistent with her previous episodes of headaches.  Denies any sudden onset headache.  Is not the worst headache of her life.  Denies any neck pain no fevers or chills.  No cough or congestion.     Physical Exam   Triage Vital Signs: ED Triage Vitals [08/19/22 0947]  Enc Vitals Group     BP 124/76     Pulse Rate 83     Resp 16     Temp 98.4 F (36.9 C)     Temp Source Oral     SpO2 100 %     Weight 250 lb (113.4 kg)     Height '5\' 5"'$  (1.651 m)     Head Circumference      Peak Flow      Pain Score 8     Pain Loc      Pain Edu?      Excl. in [place]?     Most recent vital signs: Vitals:   08/19/22 0947 08/19/22 1455  BP: 124/76 (!) 132/58  Pulse: 83 67  Resp: 16 16  Temp: 98.4 F (36.9 C)   SpO2: 100% 98%     Constitutional: Alert  Eyes: Conjunctivae are normal.  Head: Atraumatic. Nose: No congestion/rhinnorhea. Mouth/Throat: Mucous membranes are moist.   Neck: Painless ROM.  Cardiovascular:   Good peripheral circulation. Respiratory: Normal respiratory effort.  No retractions.  Gastrointestinal: Soft and nontender.  Musculoskeletal:  no deformity Neurologic:  MAE spontaneously. No gross focal neurologic deficits are appreciated.  Skin:  Skin is warm, dry and intact. No rash noted. Psychiatric: Mood and affect are normal. Speech and behavior are normal.    ED Results / Procedures / Treatments   Labs (all labs ordered are listed, but only abnormal results are displayed) Labs Reviewed - No data to  display   EKG     RADIOLOGY    PROCEDURES:  Critical Care performed: No  Procedures   MEDICATIONS ORDERED IN ED: Medications  acetaminophen (TYLENOL) tablet 1,000 mg (1,000 mg Oral Given 08/19/22 1001)  SUMAtriptan (IMITREX) injection 6 mg (6 mg Subcutaneous Given by Other 08/19/22 1002)  prochlorperazine (COMPAZINE) injection 10 mg (10 mg Intravenous Given 08/19/22 1145)  diphenhydrAMINE (BENADRYL) injection 12.5 mg (12.5 mg Intravenous Given 08/19/22 1145)     IMPRESSION / MDM / [place] / ED COURSE  I reviewed the triage vital signs and the nursing notes.                              Differential diagnosis includes, but is not limited to, migraine, tension, cluster, sdh, iph, mass  Presented to the ER for evaluation of left-sided headache and photophobia that occurred after exposure to bright lights while at work.  Has a history of migraine headaches and this feels similar.  Patient was given Tylenol as well as Imitrex as prescribed by her PCP to evaluate for symptomatic improvement.  She does report tingling on the left side of her face.  States this has happened with previous headaches.  Patient was admitted to the hospital in December of last year for left-sided weakness with reassuring work-up negative MRI and neuro evaluation.   Clinical Course as of 08/19/22 1552  Wed Aug 19, 2022  1100 Patient reassessed.  Headache has essentially resolved.  Still having some left-sided facial tingling sensation. [PR]  8250 CT head on my review and interpretation does not show any evidence of mass or bleed. [PR]  1244 Patient reassessed.  Still having little bit of tingling on the left side.  Feels like it has gotten somewhat better after migraine cocktail.  Given her persistent symptoms history will order MRI. [PR]  5397 Patient will be signed out to oncoming physician pending follow-up MRI.  Anticipate discharge assuming mri with no sign of acute stroke. [PR]     Clinical Course User Index [PR] Merlyn Lot, MD     FINAL CLINICAL IMPRESSION(S) / ED DIAGNOSES   Final diagnoses:  Bad headache  Paresthesia     Rx / DC Orders   ED Discharge Orders     None        Note:  This document was prepared using Dragon voice recognition software and may include unintentional dictation errors.    Merlyn Lot, MD 08/19/22 [phone]

## 2022-08-19 NOTE — ED Triage Notes (Signed)
Pt states that as she was at work the brightness of the light shining in her eyes caused her eye to twitch and initiated her headache, pt states that her left eye feels like its going to pop out and pt reports some neck pain, lights are dimmed in triage for pt and pt is wearing sunglasses for comfort

## 2022-10-30 ENCOUNTER — Ambulatory Visit: Payer: Self-pay | Admitting: Nurse Practitioner

## 2022-10-30 ENCOUNTER — Encounter: Payer: Self-pay | Admitting: Nurse Practitioner

## 2022-10-30 DIAGNOSIS — Z113 Encounter for screening for infections with a predominantly sexual mode of transmission: Secondary | ICD-10-CM

## 2022-10-30 LAB — HM HEPATITIS C SCREENING LAB: HM Hepatitis Screen: NEGATIVE

## 2022-10-30 LAB — HM HIV SCREENING LAB: HM HIV Screening: NEGATIVE

## 2022-10-30 LAB — WET PREP FOR TRICH, YEAST, CLUE
Trichomonas Exam: NEGATIVE
Yeast Exam: NEGATIVE

## 2022-10-30 NOTE — Progress Notes (Signed)
Pt is here for STD screening.  Wet mount results reviewed, no treatment required per the Provider.  Condoms given.  Windle Guard, RN

## 2022-10-30 NOTE — Progress Notes (Signed)
[facility] Department  STI clinic/screening visit Dominique Estes [phone]  Subjective:  Dominique Estes is a 52 y.o. female being seen today for an STI screening visit. The patient reports they do not have symptoms.  Patient reports that they do not desire a pregnancy in the next year.   They reported they are not interested in discussing contraception today.  Patient had a hysterectomy.    Patient's last menstrual period was 06/10/2021 (exact date).  Patient has the following medical conditions:   Patient Active Problem List   Diagnosis Date Noted   Chiari I malformation (Dominique Estes) 09/23/2021   Obesity BMI=42.5 04/09/2021   Smoker 1/2-1 ppd 04/09/2021   Uterine bleeding 04/09/2021    Chief Complaint  Patient presents with   SEXUALLY TRANSMITTED DISEASE    STI screening d/t new partner    HPI  Patient reports to clinic today for STD screening.  Patient is asymptomatic.    Does the patient using douching products? No  Last HIV test per patient/review of record was:  Lab Results  Component Value Date   HIV Non Reactive 09/24/2021   Patient reports last pap was: 09/2020  Screening for MPX risk: Does the patient have an unexplained rash? No Is the patient MSM? No Does the patient endorse multiple sex partners or anonymous sex partners? Yes Did the patient have close or sexual contact with a person diagnosed with MPX? No Has the patient traveled outside the [place] where MPX is endemic? No Is there a high clinical suspicion for MPX-- evidenced by one of the following No  -Unlikely to be chickenpox  -Lymphadenopathy  -Rash that present in same phase of evolution on any given body part See flowsheet for further details and programmatic requirements.   Immunization history:  Immunization History  Administered Date(s) Administered   [employer] Vaccination 06/02/2020, 06/30/2020   Tdap 01/19/2016, 04/18/2019     The  following portions of the patient's history were reviewed and updated as appropriate: allergies, current medications, past medical history, past social history, past surgical history and problem list.  Objective:  There were no vitals filed for this visit.  Physical Exam Constitutional:      Appearance: Normal appearance.  HENT:     Head: Normocephalic. No abrasion, masses or laceration. Hair is normal.     Right Ear: External ear normal.     Left Ear: External ear normal.     Nose: Nose normal.     Mouth/Throat:     Lips: Pink.     Mouth: Mucous membranes are moist. No oral lesions.     Pharynx: No oropharyngeal exudate or posterior oropharyngeal erythema.     Tonsils: No tonsillar exudate or tonsillar abscesses.  Eyes:     General: Lids are normal.        Right eye: No discharge.        Left eye: No discharge.     Conjunctiva/sclera: Conjunctivae normal.     Right eye: No exudate.    Left eye: No exudate. Pulmonary:     Effort: Pulmonary effort is normal.  Abdominal:     General: Abdomen is flat.     Palpations: Abdomen is soft.     Tenderness: There is no abdominal tenderness. There is no rebound.  Genitourinary:    Pubic Area: No rash or pubic lice.      Labia:        Right: No rash, tenderness, lesion or  injury.        Left: No rash, tenderness, lesion or injury.      Vagina: Normal. No vaginal discharge, erythema or lesions.     Rectum: Normal.     Comments: Amount Discharge: small  Odor: No pH: less than 4.5 Adheres to vaginal wall: No Color: Dominique Estes    Musculoskeletal:     Cervical back: Full passive range of motion without pain, normal range of motion and neck supple.  Lymphadenopathy:     Cervical: No cervical adenopathy.     Right cervical: No superficial, deep or posterior cervical adenopathy.    Left cervical: No superficial, deep or posterior cervical adenopathy.     Upper Body:     Right upper body: No supraclavicular, axillary or epitrochlear  adenopathy.     Left upper body: No supraclavicular, axillary or epitrochlear adenopathy.     Lower Body: No right inguinal adenopathy. No left inguinal adenopathy.  Skin:    General: Skin is warm and dry.     Findings: No lesion or rash.  Neurological:     Mental Status: She is alert and oriented to person, place, and time.  Psychiatric:        Attention and Perception: Attention normal.        Mood and Affect: Mood normal.        Speech: Speech normal.        Behavior: Behavior normal. Behavior is cooperative.      Assessment and Plan:  Dominique Estes is a 52 y.o. female presenting to the [facility] Department for STI screening  1. Screening examination for venereal disease -52 year old female in clinic today for STD screening. -Patient accepted all screenings including vaginal CT/GC and bloodwork for HIV/RPR.  Patient meets criteria for HepB screening? No. Ordered? No - low risk  Patient meets criteria for HepC screening? Yes. Ordered? Yes  Treat wet prep per standing order Discussed time line for State Lab results and that patient will be called with positive results and encouraged patient to call if she had not heard in 2 weeks.  Counseled to return or seek care for continued or worsening symptoms Recommended condom use with all sex  Patient is currently using [facility] to prevent pregnancy.    - Syphilis Serology, [place] [facility] - Chlamydia/Gonorrhea [place] [facility] - WET PREP FOR [place], YEAST, CLUE - HIV/HCV  Lab   Total time spent: 30 minutes   Return if symptoms worsen or fail to improve.    Dominique Cromer, FNP

## 2022-11-09 ENCOUNTER — Other Ambulatory Visit: Payer: Self-pay

## 2022-11-09 ENCOUNTER — Emergency Department: Payer: Managed Care, Other (non HMO)

## 2022-11-09 ENCOUNTER — Encounter: Payer: Self-pay | Admitting: Emergency Medicine

## 2022-11-09 ENCOUNTER — Emergency Department
Admission: EM | Admit: 2022-11-09 | Discharge: 2022-11-09 | Disposition: A | Discharge status: Home / Self Care | Payer: Managed Care, Other (non HMO) | Attending: Emergency Medicine | Admitting: Emergency Medicine

## 2022-11-09 DIAGNOSIS — R202 Paresthesia of skin: Secondary | ICD-10-CM | POA: Diagnosis not present

## 2022-11-09 DIAGNOSIS — R079 Chest pain, unspecified: Secondary | ICD-10-CM

## 2022-11-09 DIAGNOSIS — R0789 Other chest pain: Secondary | ICD-10-CM | POA: Insufficient documentation

## 2022-11-09 LAB — BASIC METABOLIC PANEL
Anion gap: 7 (ref 5–15)
BUN: 12 mg/dL (ref 6–20)
CO2: 26 mmol/L (ref 22–32)
Calcium: 8.6 mg/dL — ABNORMAL LOW (ref 8.9–10.3)
Chloride: 105 mmol/L (ref 98–111)
Creatinine, Ser: 0.96 mg/dL (ref 0.44–1.00)
GFR, Estimated: >60 mL/min (ref >60)
Glucose, Bld: 127 mg/dL — ABNORMAL HIGH (ref 70–99)
Potassium: 3.8 mmol/L (ref 3.5–5.1)
Sodium: 138 mmol/L (ref 135–145)

## 2022-11-09 LAB — URINALYSIS, COMPLETE (UACMP) WITH MICROSCOPIC
Bacteria, UA: NONE SEEN
Bilirubin Urine: NEGATIVE
Glucose, UA: NEGATIVE mg/dL
Hgb urine dipstick: NEGATIVE
Ketones, ur: NEGATIVE mg/dL
Leukocytes,Ua: NEGATIVE
Nitrite: NEGATIVE
Protein, ur: NEGATIVE mg/dL
Specific Gravity, Urine: 1.01 (ref 1.005–1.030)
WBC, UA: NONE SEEN WBC/hpf (ref 0–5)
pH: 7 (ref 5.0–8.0)

## 2022-11-09 LAB — CBC
HCT: 40.4 % (ref 36.0–46.0)
Hemoglobin: 13.8 g/dL (ref 12.0–15.0)
MCH: 31.3 pg (ref 26.0–34.0)
MCHC: 34.2 g/dL (ref 30.0–36.0)
MCV: 91.6 fL (ref 80.0–100.0)
Platelets: 371 10*3/uL (ref 150–400)
RBC: 4.41 MIL/uL (ref 3.87–5.11)
RDW: 13.2 % (ref 11.5–15.5)
WBC: 5.3 10*3/uL (ref 4.0–10.5)
nRBC: 0 % (ref 0.0–0.2)

## 2022-11-09 LAB — PREGNANCY, URINE: Preg Test, Ur: NEGATIVE

## 2022-11-09 LAB — TROPONIN I (HIGH SENSITIVITY): Troponin I (High Sensitivity): <2 ng/L (ref <18)

## 2022-11-09 NOTE — ED Provider Notes (Signed)
[facility] Provider Note    Event Date/Time   First MD Initiated Contact with Patient 11/09/22 1157     (approximate)  History   Chief Complaint: Chest Pain  HPI  Dominique Estes is a 52 y.o. female with a past medical history of Chiari malformation presents to the emergency department for chest discomfort/tingling sensation.  According to the patient she has had a procedure performed for Chiari malformation.  States she is supposed to go back within a week or so for another procedure and possible stent.  Patient denies any prior strokes.  Denies any weakness or numbness.  States over the past 3 days or so she has been experiencing a tingling sensation in her chest and sometimes into her shoulders more so on the left side than the right side.  Patient states she was just started on Brilinta on Thursday and symptoms started on Saturday.  Denies any "pain."  Denies any weakness or numbness.  No headache.  Physical Exam   Triage Vital Signs: ED Triage Vitals  Enc Vitals Group     BP 11/09/22 1057 (!) 142/89     Pulse Rate 11/09/22 1057 83     Resp 11/09/22 1057 18     Temp 11/09/22 1057 98.5 F (36.9 C)     Temp Source 11/09/22 1057 Oral     SpO2 11/09/22 1057 98 %     Weight --      Height --      Head Circumference --      Peak Flow --      Pain Score 11/09/22 1058 5     Pain Loc --      Pain Edu? --      Excl. in [place]? --     Most recent vital signs: Vitals:   11/09/22 1200 11/09/22 1230  BP: (!) 136/95 139/84  Pulse: 75 71  Resp: 17 17  Temp:    SpO2: 98% 97%    General: Awake, no distress.  CV:  Good peripheral perfusion.  Regular rate and rhythm  Resp:  Normal effort.  Equal breath sounds bilaterally.  Abd:  No distention.  Soft, nontender.  No rebound or guarding. Other:  Equal grip strength bilaterally.  No pronator drift.  Cranial nerves intact.   ED Results / Procedures / Treatments   EKG  EKG viewed and interpreted by myself  shows a normal sinus rhythm at 78 bpm with a narrow QRS, normal axis, normal intervals, no concerning ST changes.  RADIOLOGY  I have reviewed and interpreted the chest x-ray images.  No consolidation seen on my evaluation. Radiology has read the x-ray as negative   [place] ED: Medications - No data to display   IMPRESSION / MDM / [place] / ED COURSE  I reviewed the triage vital signs and the nursing notes.  Patient's presentation is most consistent with acute presentation with potential threat to life or bodily function.  Patient presents emergency department for tingling sensation in her chest and shoulders over the last 2 to 3 days.  Overall patient appears well, reassuring physical exam, reassuring vital signs.  Patient's workup is reassuring as well including a normal CT scan, normal urinalysis, normal chemistry, normal CBC and a negative troponin.  I did research Brilinta do not see any of her symptoms and the adverse reactions section of the medication.  I did discuss with the patient she should contact her doctor to discuss whether  or not she should continue this medication as she does relate her symptoms to the medication starting.  From an emergency standpoint workup and exam is reassuring and I believe the patient is safe for discharge home with PCP follow-up.  Patient agreeable to plan.  FINAL CLINICAL IMPRESSION(S) / ED DIAGNOSES   Paresthesias Chest pain    Note:  This document was prepared using Dragon voice recognition software and may include unintentional dictation errors.   Harvest Dark, MD 11/09/22 1253

## 2022-11-09 NOTE — ED Triage Notes (Signed)
Patient to ED for chest pain/SOB with tingling in bilateral arms. Symptoms ongoing since Saturday. Denies cardiac hx.  Patient also c/o of a dry throat when taking her blood thinner medication. Suppose to get procedure Thursday with possible stents in head.

## 2022-11-19 HISTORY — PX: OTHER SURGICAL HISTORY: SHX169

## 2023-01-18 ENCOUNTER — Encounter: Payer: Self-pay | Admitting: Physical Therapy

## 2023-01-18 ENCOUNTER — Ambulatory Visit: Payer: Managed Care, Other (non HMO) | Attending: Family Medicine | Admitting: Physical Therapy

## 2023-01-18 DIAGNOSIS — R262 Difficulty in walking, not elsewhere classified: Secondary | ICD-10-CM | POA: Diagnosis present

## 2023-01-18 DIAGNOSIS — M5416 Radiculopathy, lumbar region: Secondary | ICD-10-CM

## 2023-01-18 DIAGNOSIS — M6281 Muscle weakness (generalized): Secondary | ICD-10-CM

## 2023-01-18 DIAGNOSIS — M5459 Other low back pain: Secondary | ICD-10-CM | POA: Diagnosis present

## 2023-01-18 NOTE — Therapy (Signed)
OUTPATIENT PHYSICAL THERAPY EVALUATION   Patient Name: Dominique Estes MRN: [id number] DOB:[birth year], 52 y.o., female Today's Date: 01/18/2023  END OF SESSION:  PT End of Session - 01/18/23 1121     Visit Number 1    Number of Visits 24    Date for PT Re-Evaluation 04/12/23    Authorization Type [employer] reporting period from 01/18/2023    Authorization Time Period VL: 60 PT/OT/ST- 58 remain    Authorization - Visit Number 1    Authorization - Number of Visits 60    Progress Note Due on Visit 10    PT Start Time 0903    PT Stop Time 0950    PT Time Calculation (min) 47 min    Activity Tolerance Patient limited by pain    Behavior During Therapy [facility] for tasks assessed/performed             Past Medical History:  Diagnosis Date   Carpal tunnel syndrome of right wrist 2004   Chiari malformation type I    History of kidney stones    age15   Past Surgical History:  Procedure Laterality Date   BRAIN SURGERY     carpel tunnel release Right 2004   COLPOSCOPY  03/23/2013   TOTAL LAPAROSCOPIC HYSTERECTOMY WITH SALPINGECTOMY Bilateral 06/25/2021   Procedure: TOTAL LAPAROSCOPIC HYSTERECTOMY WITH SALPINGECTOMY, CYSTOSCOPY;  Surgeon: [name], MD;  Location: [facility] ORS;  Service: Gynecology;  Laterality: Bilateral;   Patient Active Problem List   Diagnosis Date Noted   Chiari I malformation 09/23/2021   Obesity BMI=42.5 04/09/2021   Smoker 1/2-1 ppd 04/09/2021   Uterine bleeding 04/09/2021    PCP: Tracie Harrier, MD  REFERRING PROVIDER: Allene Dillon, FNP  REFERRING DIAG: lumbar degenerative disc disease, lumbar radiculitis  Rationale for Evaluation and Treatment: Rehabilitation  THERAPY DIAG:  Other low back pain  Radiculopathy, lumbar region  Muscle weakness (generalized)  Difficulty in walking, not elsewhere classified  ONSET DATE: started a little over a year ago   SUBJECTIVE:                                                                                                                                                                                            SUBJECTIVE STATEMENT: Patient reports her condition started one morning when she was getting up to go to work and her whole left side started tingling and feeling tight. Her daughter took her to the emergency room where they did a lot of testing and they diagnosed her with a chiari I malformation. This gives her a lot of pain in her head. She had surgery for the chiari  malformation last year. She continued to have the pain and the surgeon said to watch over several months. She had a stent put in two blood vessels in the back of her head  in February because they were too narrow. She has had back pain on and off for many years. She lost and gained weight. About a year ago it started getting worse. Last year around June or July the pain got more intense but she thought it was the pressure from her head that went down her neck and into her back. She thought her back pain was related to her trouble with her head and neck. Her PCP insisted she go to a back doctor. She saw Allene Dillon, FNP patient did a an exam and said she had arthritis in her back. When she went in for the injection recently (left L3-4 transforaminal epidural injection under fluoroscopic guidance on 01/14/2023) by Dr. Sharlet Salina who she states said she had a disc sticking out in her back and that is why she is having so much pain. Since the injection, the pain has not eased up. For the past two weeks she has had to walk with a cane. Walking into the store to get a prescription and she could not believe how much pain she was in. Patient has been weak in her legs at times, more in the left. It will get numb and tingling. She has lost her balance but has not fallen. The pain comes and it will stop, but it comes more than it is gone now.  There is no known MOI for the back pain. She continues to get worse left sided neck pain when her back and  legs are bothering her. She states it feels like the pain starts at her left neck and it travels down to her leg. Sometimes she gets head and neck pain with the back pain, but not every time. She has had episodes of left arm and hand feeling really weak. She is right handed.   She has had 2-3 times where she had to urinate but she did not make it in time and she found urine running down her leg, which surprised her.   PERTINENT HISTORY:  Patient is a 52 y.o. female who presents to outpatient physical therapy with a referral for medical diagnosis lumbar degenerative disc disease, lumbar radiculitis. This patient's chief complaints consist of left sided low back pain and left LE pain, paresthesia, and weakness with concurrent L neck, head, and UE pain, paresthesia, and weakness leading to the following functional deficits: difficulty with activities that require standing and walking, household and community mobility, going into store/shopping, housework, cooking, carrying, sleeping, dressing, bathing, working, prolonged sitting. 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 Patient denies hx of cancer, stroke, seizures, lung problems, heart problems, diabetes, unexplained weight loss, and osteoporosis, spinal surgery outside of her cervical spine. She has had 2-3 times where she had to urinate but she did not make it in time and she found urine running down her leg, which surprised her, she does find herself stumbling and dropping things.   PAIN:  Are you having pain? Yes: NPRS scale: Current: 5/10 (sitting),  Best: 5/10, Worst: 10/10. Pain location: burning starts in the left low back, travels down to the buttocks, the side and front of the thigh, to the front of the lower leg to the foot.  Pain description: pinch, tightness, numbness, tingling, burning, like it is asleep. Aggravating factors: walking, standing, washing dishes, cooking, picking things up, carrying things, prolonged sitting.  Relieving factors: sitting, using a cane, pain medication  FUNCTIONAL LIMITATIONS: difficulty with activities that require standing and walking, household and community mobility, going into store/shopping, housework, cooking, carrying, sleeping, dressing, bathing, working, prolonged sitting.   LEISURE: watch, wants to go back to work, visit her cousin, vacations, travel.   PRECAUTIONS: Other: don't lift anything more than 5# at least after the injection.   WEIGHT BEARING RESTRICTIONS: No  FALLS:  Has patient fallen in last 6 months? No She has been worried about falling because she has lost her balance a few times. Her left leg has given out on her.   LIVING ENVIRONMENT: Lives with: her two daughters live with her Lives in: 1 story home Stairs: 4 steps to enter with bilateral handrail that can be reached at the same.  Has following equipment at home: Single point cane  OCCUPATION: was on disability because of problems related to chiari malformation, now just out of work. Used to [employer] (was working from home, typing, phone; was in office with more walking).   PLOF: Independent  PATIENT GOALS: "be able to get this under control" "be able to take care of my self" "to be able to function a little better if not completely" would like to go back to work.   NEXT MD VISIT: February 16, 2023  OBJECTIVE  DIAGNOSTIC FINDINGS:  Brain MRI report from 08/19/2022: CLINICAL DATA:  Acute neuro deficit.  Headache.   EXAM: MRI HEAD WITHOUT CONTRAST   TECHNIQUE: Multiplanar, multiecho pulse sequences  of the brain and surrounding structures were obtained without intravenous contrast.   COMPARISON:  MRI head 09/23/2021   FINDINGS: Brain: Interval suboccipital craniectomy for Chiari decompression. Resection of the posterior arch of C1 as well. Cerebellar tonsils remain low lying but less impacted compared to the prior study.   Ventricle size and cerebral volume normal. Negative for acute infarct. Few small white matter hyperintensities bilaterally. Dural base calcification over the left frontal convexity. This is unchanged and does not enhance therefore is not likely to be meningioma probably benign dural calcification. Partial empty sella.   Vascular: Normal arterial flow voids.   Skull and upper cervical spine: No focal skeletal lesion.   Sinuses/Orbits: Mild mucosal edema paranasal sinuses. Negative orbit   Other: None   IMPRESSION: 1. Negative for acute infarct. 2. Interval Chiari decompression. Cerebellar tonsils remain low lying but less impacted compared to the prior study.     Electronically Signed   By: Franchot Gallo M.D.   On: 08/19/2022 16:16  Cervical spine MRI report from 09/24/2021: CLINICAL DATA:  Myelopathy, acute or progressive. Chiari malformation.   EXAM: MRI CERVICAL SPINE WITHOUT AND WITH CONTRAST   TECHNIQUE: Multiplanar and multiecho pulse sequences of the cervical spine, to include the craniocervical junction and cervicothoracic junction, were obtained without and with intravenous contrast.   CONTRAST:  20mL GADAVIST GADOBUTROL 1 MMOL/ML IV SOLN   COMPARISON:  MR head without and with contrast 09/23/2021   FINDINGS: Alignment: This no significant listhesis is present. Straightening of the normal cervical lordosis is present.   Vertebrae: Fatty endplate marrow changes are noted along the  inferior anterior endplates of C5 and C6. Marrow signal and vertebral body heights are otherwise normal.   Cord: Cervical spinal cord is within normal  limits the skull base through T3-4. No syrinx is present.   Posterior Fossa, vertebral arteries, paraspinal tissues: The cerebellar tonsils extend 8 mm below the foramen magnum in the midline and are somewhat pointed. There is crowding of the foramen magnum.   Disc levels:   C2-3: Negative.   C3-4: Negative.   C4-5: A central disc protrusion effaces the ventral CSF and potentially contacts the cord. Mild foraminal narrowing is present bilaterally.   C5-6: Mild disc bulging is present without significant stenosis.   C6-7: A right paramedian disc protrusion effaces the ventral CSF and likely contacts the cord. No abnormal cord signal is present. Foramina are patent.   C7-T1: Mild facet hypertrophy is present bilaterally. No significant disc protrusion or stenosis is present.   IMPRESSION: 1. 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 2. Central disc protrusion at C4-5 with mild central canal stenosis and bilateral foraminal narrowing. 3. Right paramedian disc protrusion at C6-7 with mild central canal stenosis but no abnormal cord signal or foraminal narrowing. 4. Mild disc bulging at C5-6 without significant stenosis. 5. Mild facet hypertrophy bilaterally at C7-T1 without significant disc protrusion or stenosis.     Electronically Signed   By: San Morelle M.D.   On: 09/24/2021 19:06  Thoracic spine MRI report from 09/24/2021: CLINICAL DATA:  Myelopathy. Numbness and tingling in the left face and extremities.   EXAM: MRI THORACIC WITHOUT AND WITH CONTRAST   TECHNIQUE: Multiplanar and multiecho pulse sequences of the thoracic spine were obtained without and with intravenous contrast.   CONTRAST:  54mL GADAVIST GADOBUTROL 1 MMOL/ML IV SOLN   COMPARISON:  None.   FINDINGS: Alignment: No significant listhesis is present in the thoracic spine. Thoracic kyphosis is within normal limits.   Vertebrae: Marrow signal and vertebral body heights are  normal.   Cord:  Normal signal and morphology.   Paraspinal and other soft tissues: The paraspinous soft tissues are within normal limits. Visualized lung fields are clear. Hemangioma of the posterior right lobe of the liver again noted.   Disc levels:   No significant thoracic disc protrusion or stenosis is present. Foramina are patent bilaterally throughout the thoracic spine.   Postcontrast images demonstrate no pathologic enhancement.   IMPRESSION: Negative MRI of the thoracic spine. No acute or focal lesion to explain the patient's symptoms.     Electronically Signed   By: San Morelle M.D.   On: 09/24/2021 19:19  Lumbar spine MRI report from 09/24/2021: CLINICAL DATA:  Myelopathy, acute or progressive. Numbness in the left face and extremities. Chiari malformation.   EXAM: MRI LUMBAR SPINE WITHOUT AND WITH CONTRAST   TECHNIQUE: Multiplanar and multiecho pulse sequences of the lumbar spine were obtained without and with intravenous contrast.   CONTRAST:  3mL GADAVIST GADOBUTROL 1 MMOL/ML IV SOLN   COMPARISON:  None.   FINDINGS: Segmentation: 5 non rib-bearing lumbar type vertebral bodies are present. The lowest fully formed vertebral body is L5.   Alignment: No significant listhesis is present. Lumbar lordosis preserved.   Vertebrae:  Marrow signal and vertebral body heights are normal.   Conus medullaris and cauda equina: Conus extends to the T12-L1 level. Conus and cauda equina appear normal.   Paraspinal and other soft tissues: Limited imaging the abdomen is unremarkable. There is no significant adenopathy. No solid organ lesions are present.  Disc levels:   L1-2: Negative.   L2-3: Negative.   L3-4: A broad-based is asymmetric to the left. Facet hypertrophy is worse on the left. There is some scratched at the central canal is patent. Mild left foraminal narrowing is present.   L4-5: Mild disc bulging and bilateral facet hypertrophy is  present. No significant stenosis is present.   L5-S1: Mild facet hypertrophy is worse on the right. No significant disc protrusion or stenosis is present.   No pathologic enhancement is present.   IMPRESSION: 1. Mild left foraminal narrowing at L3-4 secondary to a leftward disc bulging and facet hypertrophy. 2. Mild disc bulging and bilateral facet hypertrophy at L4-5 without significant stenosis. 3. Mild right facet hypertrophy at L5-S1 without significant disc protrusion or stenosis.     Electronically Signed   By: San Morelle M.D.   On: 09/24/2021 19:20   SELF- REPORTED FUNCTION FOTO score: 36/100 (lumbar spine questionnaire)  OBSERVATION/INSPECTION Posture Posture (standing): increased  lumbar lordosis, bilateral genuvalgum, weight shifted off L LE with L knee slightly flexed.  Patient became visibly uncomfortable in left LE and L UE with onset of shifting in chair, shifting away from left side, and moving L UE with grimacing by end of subjective exam.  Anthropometrics Tremor: none Body composition: BMI 42.2 Skin: WFL where visualized Edema: WFL Functional Mobility Bed mobility: sit <> supine mod I for increased time/effort and pain Transfers: sit <> stand mod I for increased time/effort use of B UE and pain.  Gait: grossly WFL for household and short community ambulation. More detailed gait analysis deferred to later date as needed. Ambulates with SPC in R hand with antalgic gait favoring L LE. Cane adjusted too high, but unable to adjust due to plastic inside cane.   SPINE MOTION  LUMBAR SPINE AROM *Indicates pain Flexion: fingers to ankles, increased L leg pain and increased L UE and neck pain (feels like pain is traveling from L LE up to neck and side of face).  Extension: 0% increased left buttock pain. Side Flexion:   R finger to proximal patella  L finger 4 inches proximal to patella, increased pain in left thigh Rotation:  R WNL  L  increased  pain at L TL junction  NEUROLOGICAL Upper Motor Neuron Screen Hoffman's negative bilaterally.  Dermatomes L2-S2 with altered sensation to light touch on L compared to right with left feeling more "tingly" or "needly"   PERIPHERAL JOINT MOTION (in degrees) PASSIVE RANGE OF MOTION (PROM) BLE appears grossly WFL for basic mobility, further testing deferred due to high irritability.    MUSCLE PERFORMANCE (MMT):  *Indicates pain 01/18/23 Date Date  Joint/Motion R/L R/L R/L  Hip     Flexion (L1, L2) 5/3* / /  Knee     Extension (L3) 5/4* / /  Flexion (S2) 5/4* / /  Ankle/Foot     Dorsiflexion (L4) 5/3+* / /  Great toe extension (L5) 5/4+* / /  Eversion (S1) 5/4* / /  Plantarflexion (S1) 5/4* / /  Comments:  01/18/2023: Cannot effectively toe walk on L LE with BUE support, did not test heel walking. Non-seated positions deferred due to irritability of symptoms.   SPECIAL TESTS: LOWER LIMB NEURODYNAMIC TESTS Straight Leg Raise (Sciatic nerve)  R  = positive right thigh  L  = positive left thigh and left UT  ACCESSORY MOTION: Deferred due to irritability  PALPATION: Deferred due to irritability  TODAY'S TREATMENT:  Therapeutic exercise: to centralize symptoms and improve  ROM, strength, muscular endurance, and activity tolerance required for successful completion of functional activities.  - hooklying LTR 1x20 each side  Pt required multimodal cuing for proper technique and to facilitate improved neuromuscular control, strength, range of motion, and functional ability resulting in improved performance and form.  PATIENT EDUCATION:  Education details: Exercise purpose/form. Self management techniques. Education on diagnosis, prognosis, POC, anatomy and physiology of current condition Education on HEP including handout  Person educated: Patient Education method: Explanation, Tactile cues, Verbal cues, and Handouts Education comprehension: verbalized understanding, returned  demonstration, and needs further education  HOME EXERCISE PROGRAM: Access Code: [email or web address] URL: [email or web address] Date: 01/18/2023 Prepared by: Rosita Kea  Exercises - Supine Lower Trunk Rotation  - 2 x daily - 1 sets - 20 reps - 1-5 seconds hold  ASSESSMENT:  CLINICAL IMPRESSION: Patient is a 52 y.o. female referred to outpatient physical therapy with a medical diagnosis of lumbar degenerative disc disease, lumbar radiculitis who presents with signs and symptoms consistent with left sided lumbar low back pain with radiculopathy throughout L LE in the setting of history of chiari malformation with concurrent L UE, L neck, and left face pain and paresthesia. Patient presents with significant ROM, pain, altered sensation, paresthesia, motor control, neurologic weakness, neurodynamic, muscle performance (strength/power/endurance), gait, balance, and activity tolerance impairments that are limiting ability to complete activities that require standing and walking, household and community mobility, going into store/shopping, housework, cooking, carrying, sleeping, dressing, bathing, working, prolonged sitting without difficulty. Unable to rule out more proximal contribution from chiari malformation to symptoms. Patient with elevated irritability today and exam was limited. Plan to further screen UMN next session. Patient will benefit from skilled physical therapy intervention to address current body structure impairments and activity limitations to improve function and work towards goals set in current POC in order to return to prior level of function or maximal functional improvement.     OBJECTIVE IMPAIRMENTS: Abnormal gait, decreased activity tolerance, decreased balance, decreased endurance, decreased knowledge of use of DME, decreased mobility, difficulty walking, decreased ROM, decreased strength, hypomobility, impaired perceived functional ability, impaired sensation, improper body  mechanics, obesity, and pain.   ACTIVITY LIMITATIONS: carrying, lifting, bending, sitting, standing, squatting, sleeping, stairs, transfers, bed mobility, continence, bathing, dressing, and locomotion level  PARTICIPATION LIMITATIONS: meal prep, cleaning, laundry, interpersonal relationship, shopping, community activity, occupation, yard work, and   difficulty activities that require standing and walking, household and community mobility, going into store/shopping, housework, cooking, carrying, sleeping, dressing, bathing, working, prolonged sitting  PERSONAL FACTORS: Fitness, Past/current experiences, Time since onset of injury/illness/exacerbation, and 3+ comorbidities:   idiopathic intracranial hypertension (IIH) s/p stenting right 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   REHAB POTENTIAL: Good  CLINICAL DECISION MAKING: Evolving/moderate complexity  EVALUATION COMPLEXITY: Moderate   GOALS: Goals reviewed with patient? No  SHORT TERM GOALS: Target date: 02/01/2023  Patient will be independent with initial home exercise program for self-management of symptoms. Baseline: Initial HEP provided at IE (01/18/23); Goal status: INITIAL   LONG TERM GOALS: Target date: 04/12/2023  Patient will be independent with a long-term home exercise program for self-management of symptoms.  Baseline: Initial HEP provided at IE (01/18/23); Goal status: INITIAL  2.  Patient will demonstrate improved FOTO  to equal or greater than 52 by visit #12 to demonstrate improvement in overall condition and self-reported functional ability.  Baseline: 36 (01/18/23); Goal status:  INITIAL  3.  Patient will ambulate 1800 feet on 6 Minute Walk Test with LRAD to progress towards age matched norms and improve her community ambulation. Baseline: to be tested visit 2 as appropriate (01/18/23); Goal status: INITIAL  4.  Patient will demonstrate L LE strength of equal or greater than 4+/5 in hip, ankle, and knee motions with no increase in pain to improve fall risk and ability to complete ADLs and IADLs safely and with less difficulty.  Baseline: limited and painful (3-4/5 MMT with pain) see objective exam above (01/18/23); Goal status: INITIAL  5.  Patient will complete community, work and/or recreational activities with 50% less limitation due to current condition.  Baseline: difficulty with activities that require standing and walking, household and community mobility, going into store/shopping, housework, cooking, carrying, sleeping, dressing, bathing, working, prolonged sitting (01/18/23); Goal status: INITIAL    PLAN:  PT FREQUENCY: 1-2x/week  PT DURATION: 12 weeks  PLANNED INTERVENTIONS: Therapeutic exercises, Therapeutic activity, Neuromuscular re-education, Balance training, Gait training, Patient/Family education, Self Care, Joint mobilization, Stair training, DME instructions, Dry Needling, Electrical stimulation, Spinal mobilization, Cryotherapy, Moist heat, Manual therapy, and Re-evaluation.  PLAN FOR NEXT SESSION: update HEP as appropriate, progressive core/LE/functional strengthening, ROM, motor control, and balance exercises as tolerated, manual therapy as needed, neurodynamic exercises tolerated.    Everlean Alstrom. Graylon Good, PT, DPT 01/18/23, 11:23 AM  [facility] [address] [place], [place] [address] P: [phone] I F: [phone]

## 2023-01-21 ENCOUNTER — Encounter: Payer: Self-pay | Admitting: Physical Therapy

## 2023-01-21 ENCOUNTER — Ambulatory Visit: Payer: Managed Care, Other (non HMO) | Admitting: Physical Therapy

## 2023-01-21 VITALS — BP 122/64 | HR 73

## 2023-01-21 DIAGNOSIS — M5416 Radiculopathy, lumbar region: Secondary | ICD-10-CM

## 2023-01-21 DIAGNOSIS — M5459 Other low back pain: Secondary | ICD-10-CM | POA: Diagnosis not present

## 2023-01-21 DIAGNOSIS — R262 Difficulty in walking, not elsewhere classified: Secondary | ICD-10-CM

## 2023-01-21 DIAGNOSIS — M6281 Muscle weakness (generalized): Secondary | ICD-10-CM

## 2023-01-21 NOTE — Therapy (Signed)
OUTPATIENT PHYSICAL THERAPY TREATMENT NOTE   Patient Name: SHERETTE MOSCHELLA MRN: [id number] DOB:Jan 11, 1971, 52 y.o., female Today's Date: 01/21/2023  PCP: Tracie Harrier, MD  REFERRING PROVIDER: Allene Dillon, FNP   END OF SESSION:   PT End of Session - 01/21/23 1704     Visit Number 2    Number of Visits 24    Date for PT Re-Evaluation 04/12/23    Authorization Type [employer] reporting period from 01/18/2023    Authorization Time Period VL: 60 PT/OT/ST- 64 remain    Authorization - Visit Number 2    Authorization - Number of Visits 60    Progress Note Due on Visit 10    PT Start Time 1652    PT Stop Time 1730    PT Time Calculation (min) 38 min    Activity Tolerance Patient limited by pain    Behavior During Therapy [facility] for tasks assessed/performed             Past Medical History:  Diagnosis Date   Carpal tunnel syndrome of right wrist 2004   Chiari malformation type I    History of kidney stones    age15   Past Surgical History:  Procedure Laterality Date   BRAIN SURGERY     carpel tunnel release Right 2004   COLPOSCOPY  03/23/2013   TOTAL LAPAROSCOPIC HYSTERECTOMY WITH SALPINGECTOMY Bilateral 06/25/2021   Procedure: TOTAL LAPAROSCOPIC HYSTERECTOMY WITH SALPINGECTOMY, CYSTOSCOPY;  Surgeon: [name], MD;  Location: [facility] ORS;  Service: Gynecology;  Laterality: Bilateral;   Patient Active Problem List   Diagnosis Date Noted   Chiari I malformation 09/23/2021   Obesity BMI=42.5 04/09/2021   Smoker 1/2-1 ppd 04/09/2021   Uterine bleeding 04/09/2021    REFERRING DIAG: lumbar degenerative disc disease, lumbar radiculitis   THERAPY DIAG:  Other low back pain  Radiculopathy, lumbar region  Muscle weakness (generalized)  Difficulty in walking, not elsewhere classified  Rationale for Evaluation and Treatment Rehabilitation  PERTINENT HISTORY: Patient is a 52 y.o. female who presents to outpatient physical therapy with a referral for medical diagnosis  lumbar degenerative disc disease, lumbar radiculitis. This patient's chief complaints consist of left sided low back pain and left LE pain, paresthesia, and weakness with concurrent L neck, head, and UE pain, paresthesia, and weakness leading to the following functional deficits: difficulty with activities that require standing and walking, household and community mobility, going into store/shopping, housework, cooking, carrying, sleeping, dressing, bathing, working, prolonged sitting. 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 Patient denies hx of cancer, stroke, seizures, lung problems, heart problems, diabetes, unexplained weight loss, and osteoporosis, spinal surgery outside of her cervical spine. She has had 2-3 times where she had to urinate but she did not make it in time and she found urine running down her leg, which surprised her, she does find herself stumbling and dropping things.   PRECAUTIONS: fall, don't lift anything more than 5# at least after the injection.   SUBJECTIVE:  SUBJECTIVE STATEMENT:  patient reports she had to call her doctor to get a different pain medication, because gabapentin is not helping enough. She was prescreibed Methocarbamol (muscle relaxant) and took her first dose last night. She thought she fell asleep more quickly last night but she has not noticed a difference in her pain yet. She she  noticed a vibration in her left leg up to her glute and is wondering if that is normal. It happened for a while, then stopped.    PAIN:  Are you having pain? NPRS 8/10 across low back and down left leg to knee, with numbness in her foot.    OBJECTIVE:   Vitals:   01/21/23 1705  BP: 122/64  Pulse: 73  SpO2: 100%   NEUROLOGICAL Upper Motor Neuron Screen Babinski and Clonus (ankle) negative bilaterally.  Deep Tendon Reflexes R/L  1+/1+ Quadriceps reflex (L4) 1+/1+ Achilles reflex (S1)  FUNCTIONAL/BALANCE TESTS 6 Minute Walk Test: 520 feet with SPC in R UE  (multiple standing rest breaks, limted by back pain)  TODAY'S TREATMENT:  Therapeutic exercise: to centralize symptoms and improve ROM, strength, muscular endurance, and activity tolerance required for successful completion of functional activities.  - vitals measurements to assess prior to exercise (see above) - reflex testing (see above).  - 6 Minute Walk Test (see above) - seated lumbar flexion roll out with ball, 1x3 min self selected pace - hooklying curl up, 4x10 seconds - Quadruped bird dog (alternating shoulder flexion/contralateral hip extension with core muscles braced). 1x5 each side.  - child's pose 1x30-60 seconds - Education on HEP including handout    Pt required multimodal cuing for proper technique and to facilitate improved neuromuscular control, strength, range of motion, and functional ability resulting in improved performance and form.   PATIENT EDUCATION:  Education details: Exercise purpose/form. Self management techniques. Education on diagnosis, prognosis, POC, anatomy and physiology of current condition Education on HEP including handout  Reviewed cancelation/no-show policy with patient and confirmed patient has correct phone number for clinic; patient verbalized understanding (01/21/23).  Person educated: Patient Education method: Explanation, Corporate treasurer cues, Verbal cues, and Handouts Education  comprehension: verbalized understanding, returned demonstration, and needs further education   HOME EXERCISE PROGRAM: Access Code: [email or web address] URL: [email or web address] Date: 01/18/2023 Prepared by: Rosita Kea   Exercises - Supine Lower Trunk Rotation  - 2 x daily - 1 sets - 20 reps - 1-5 seconds hold  [place] at [email or web address]" using code: BHEVYHD Stability Ball (SB) Lumbar Flexion Stretch -  Repeat 25 Repetitions, Hold 5 Seconds, Perform 1 Times a Day   ASSESSMENT:   CLINICAL IMPRESSION: Patient arrives with continued pain in the low back and down the left leg to the knee. She was able to ambulate 520 feet with SPC in R UE with several standing rests during the 6 Minute Walk Test. She was most limited by low back pain. LE DTR and UMN screen were WNL. She had difficulty with supine exercises due to neck and head pain so they were discontinued. She was more successful with bird dog but continued to headache from previous exercise. Updated HEP to include lumbar flexion slide. Plan to attempt bird dog exercise without curl up exercise next session. Patient would benefit from continued management of limiting condition by skilled physical therapist to address remaining impairments and functional limitations to work towards stated goals and return to PLOF or maximal functional independence.    From Initial PT Eval 01/18/2023: Patient is a 52 y.o.  female referred to outpatient physical therapy with a medical diagnosis of lumbar degenerative disc disease, lumbar radiculitis who presents with signs and symptoms consistent with left sided lumbar low back pain with radiculopathy throughout L LE in the setting of history of chiari malformation with concurrent L UE, L neck, and left face pain and paresthesia. Patient presents with significant ROM, pain, altered sensation, paresthesia, motor control, neurologic weakness, neurodynamic, muscle performance  (strength/power/endurance), gait, balance, and activity tolerance impairments that are limiting ability to complete activities that require standing and walking, household and community mobility, going into store/shopping, housework, cooking, carrying, sleeping, dressing, bathing, working, prolonged sitting without difficulty. Unable to rule out more proximal contribution from chiari malformation to symptoms. Patient with elevated irritability today and exam was limited. Plan to further screen UMN next session. Patient will benefit from skilled physical therapy intervention to address current body structure impairments and activity limitations to improve function and work towards goals set in current POC in order to return to prior level of function or maximal functional improvement.      OBJECTIVE IMPAIRMENTS: Abnormal gait, decreased activity tolerance, decreased balance, decreased endurance, decreased knowledge of use of DME, decreased mobility, difficulty walking, decreased ROM, decreased strength, hypomobility, impaired perceived functional ability, impaired sensation, improper body mechanics, obesity, and pain.    ACTIVITY LIMITATIONS: carrying, lifting, bending, sitting, standing, squatting, sleeping, stairs, transfers, bed mobility, continence, bathing, dressing, and locomotion level   PARTICIPATION LIMITATIONS: meal prep, cleaning, laundry, interpersonal relationship, shopping, community activity, occupation, yard work, and   difficulty activities that require standing and walking, household and community mobility, going into store/shopping, housework, cooking, carrying, sleeping, dressing, bathing, working, prolonged sitting   PERSONAL FACTORS: Fitness, Past/current experiences, Time since onset of injury/illness/exacerbation, and 3+ comorbidities:   idiopathic intracranial hypertension (IIH) s/p stenting right transverse to sigmoid sinus venous stenosis 12-03-22, B/L tegmen tympani meningocele,  left foramen ovale encephalocele and chiari s/p decompression (craniotomy and resection of posterior arch of C1) 12/26/21, left sided paresthesia throughout left neck/face, L UE, L LE, headaches, current smoker, R CTM (release 06/2003), hysterectomy are also affecting patient's functional outcome.    REHAB POTENTIAL: Good   CLINICAL DECISION MAKING: Evolving/moderate complexity   EVALUATION COMPLEXITY: Moderate     GOALS: Goals reviewed with patient? No   SHORT TERM GOALS: Target date: 02/01/2023   Patient will be independent with initial home exercise program for self-management of symptoms. Baseline: Initial HEP provided at IE (01/18/23); Goal status: MET     LONG TERM GOALS: Target date: 04/12/2023   Patient will be independent with a long-term home exercise program for self-management of symptoms.  Baseline: Initial HEP provided at IE (01/18/23); Goal status: In-progress   2.  Patient will demonstrate improved FOTO to equal or greater than 52 by visit #12 to demonstrate improvement in overall condition and self-reported functional ability.  Baseline: 36 (01/18/23); Goal status: In-progress   3.  Patient will ambulate 1800 feet on 6 Minute Walk Test with LRAD to progress towards age matched norms and improve her community ambulation. Baseline: to be tested visit 2 as appropriate (01/18/23); 520 feet with SPC in R UE (01/21/2023); Goal status: In-progress   4.  Patient will demonstrate L LE strength of equal or greater than 4+/5 in hip, ankle, and knee motions with no increase in pain to improve fall risk and ability to complete ADLs and IADLs safely and with less difficulty.  Baseline: limited and painful (3-4/5 MMT with pain) see objective exam above (01/18/23);  Goal status: In-progress   5.  Patient will complete community, work and/or recreational activities with 50% less limitation due to current condition.  Baseline: difficulty with activities that require standing and  walking, household and community mobility, going into store/shopping, housework, cooking, carrying, sleeping, dressing, bathing, working, prolonged sitting (01/18/23); Goal status: In-progress       PLAN:   PT FREQUENCY: 1-2x/week   PT DURATION: 12 weeks   PLANNED INTERVENTIONS: Therapeutic exercises, Therapeutic activity, Neuromuscular re-education, Balance training, Gait training, Patient/Family education, Self Care, Joint mobilization, Stair training, DME instructions, Dry Needling, Electrical stimulation, Spinal mobilization, Cryotherapy, Moist heat, Manual therapy, and Re-evaluation.   PLAN FOR NEXT SESSION: update HEP as appropriate, progressive core/LE/functional strengthening, ROM, motor control, and balance exercises as tolerated, manual therapy as needed, neurodynamic exercises tolerated.     Nancy Nordmann, PT, DPT 01/21/2023, 6:05 PM  [facility] [address] [place],  [address] P: [phone] I F: [phone]

## 2023-01-26 ENCOUNTER — Encounter: Payer: Self-pay | Admitting: Physical Therapy

## 2023-01-26 ENCOUNTER — Ambulatory Visit: Payer: Managed Care, Other (non HMO) | Admitting: Physical Therapy

## 2023-01-26 DIAGNOSIS — M5459 Other low back pain: Secondary | ICD-10-CM

## 2023-01-26 DIAGNOSIS — R262 Difficulty in walking, not elsewhere classified: Secondary | ICD-10-CM

## 2023-01-26 DIAGNOSIS — M5416 Radiculopathy, lumbar region: Secondary | ICD-10-CM

## 2023-01-26 DIAGNOSIS — M6281 Muscle weakness (generalized): Secondary | ICD-10-CM

## 2023-01-26 NOTE — Therapy (Signed)
OUTPATIENT PHYSICAL THERAPY TREATMENT NOTE   Patient Name: Dominique Estes MRN: [id number] DOB:[birth year], 52 y.o., female Today's Date: 01/26/2023  PCP: Barbette Reichmann, MD  REFERRING PROVIDER: Burman Freestone, FNP   END OF SESSION:   PT End of Session - 01/26/23 0957     Visit Number 3    Number of Visits 24    Date for PT Re-Evaluation 04/12/23    Authorization Type [employer] reporting period from 01/18/2023    Authorization Time Period VL: 60 PT/OT/ST- 60 remain    Authorization - Visit Number 3    Authorization - Number of Visits 60    Progress Note Due on Visit 10    PT Start Time 0950    PT Stop Time 1028    PT Time Calculation (min) 38 min    Activity Tolerance Patient limited by pain    Behavior During Therapy [facility] for tasks assessed/performed              Past Medical History:  Diagnosis Date   Carpal tunnel syndrome of right wrist 2004   Chiari malformation type I    History of kidney stones    age15   Past Surgical History:  Procedure Laterality Date   BRAIN SURGERY     carpel tunnel release Right 2004   COLPOSCOPY  03/23/2013   TOTAL LAPAROSCOPIC HYSTERECTOMY WITH SALPINGECTOMY Bilateral 06/25/2021   Procedure: TOTAL LAPAROSCOPIC HYSTERECTOMY WITH SALPINGECTOMY, CYSTOSCOPY;  Surgeon: Christeen Douglas, MD;  Location: [facility] ORS;  Service: Gynecology;  Laterality: Bilateral;   Patient Active Problem List   Diagnosis Date Noted   Chiari I malformation 09/23/2021   Obesity BMI=42.5 04/09/2021   Smoker 1/2-1 ppd 04/09/2021   Uterine bleeding 04/09/2021    REFERRING DIAG: lumbar degenerative disc disease, lumbar radiculitis   THERAPY DIAG:  Other low back pain  Radiculopathy, lumbar region  Muscle weakness (generalized)  Difficulty in walking, not elsewhere classified  Rationale for Evaluation and Treatment Rehabilitation  PERTINENT HISTORY: Patient is a 52 y.o. female who presents to outpatient physical therapy with a referral for medical  diagnosis lumbar degenerative disc disease, lumbar radiculitis. This patient's chief complaints consist of left sided low back pain and left LE pain, paresthesia, and weakness with concurrent L neck, head, and UE pain, paresthesia, and weakness leading to the following functional deficits: difficulty with activities that require standing and walking, household and community mobility, going into store/shopping, housework, cooking, carrying, sleeping, dressing, bathing, working, prolonged sitting. 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 Patient denies hx of cancer, stroke, seizures, lung problems, heart problems, diabetes, unexplained weight loss, and osteoporosis, spinal surgery outside of her cervical spine. She has had 2-3 times where she had to urinate but she did not make it in time and she found urine running down her leg, which surprised her, she does find herself stumbling and dropping things.   PRECAUTIONS: fall, don't lift anything more than 5# at least after the injection.   SUBJECTIVE:  SUBJECTIVE STATEMENT:  patient arrives with [facility] and reports she has had a headache since she woke up this morning. She has been taking a muscle relaxant since Friday but she does not feel like it is helping her so she called her doctor who said it could take some time to work and recommended she continue taking it. She states she felt  okay after last PT session but is not doing well today with high level of pain in her back and left leg as well as a headache. She went to the dentist this morning. She states her HEP is going okay.     PAIN:  Are you having pain? NPRS 9/10 across low back and down left leg to feet with tingling in the foot as well.  6/10 headache at base of scull.    OBJECTIVE:   TODAY'S TREATMENT:  Therapeutic exercise: to centralize symptoms and improve ROM, strength, muscular endurance, and activity tolerance required for successful completion of functional activities.  - NuStep level 1 using bilateral upper and lower extremities. Seat/handle setting 9/9. For improved extremity mobility, muscular endurance, and activity tolerance; and to induce the analgesic effect of aerobic exercise, stimulate improved joint nutrition, and prepare body structures and systems for following interventions. x 6 min. Unable to continue with left UE due to pain/numbness. Average SPM = 42 (cued to get above 60 spm) (Manual therapy - see below) - seated sciatic nerve glide, slider technique, 1x15 each side.  - standing lumbar extension over elevated plinth, 2x10 (worsening in left leg) - seated lumbar flexion roll out with ball, 1x3 min self selected pace - Education on HEP including handout   Manual therapy: to reduce pain and tissue tension, improve range of motion, neuromodulation, in order to promote improved ability to complete functional activities. SEATED IN MASSAGE CHAIR - STM to lumbar paraspinals and QL as well as left glute region.    Pt required multimodal cuing for proper technique and to facilitate improved neuromuscular control, strength, range of motion, and functional ability resulting in improved performance and form.   PATIENT EDUCATION:  Education details: Exercise purpose/form. Self management techniques. Education on diagnosis, prognosis, POC, anatomy and physiology of current condition Education on HEP  including handout  Reviewed cancelation/no-show policy with patient and confirmed patient has correct phone number for clinic; patient verbalized understanding (01/21/23).  Person educated: Patient Education method: Explanation, Actor cues, Verbal cues, and Handouts Education comprehension: verbalized understanding, returned demonstration, and needs further education   HOME EXERCISE PROGRAM: Access Code: [email or web address] URL: [email or web address] Date: 01/26/2023 Prepared by: Norton Blizzard  Exercises - Supine Lower Trunk Rotation  - 2 x daily - 1 sets - 20 reps - 1-5 seconds hold - Seated Slump Nerve Glide  - 2 x daily - 1 sets - 15 reps  HOME EXERCISE PROGRAM [BHEVYHD] View at "[email or web address]" using code: BHEVYHD Stability Ball (SB) Lumbar Flexion Stretch -  Repeat 25 Repetitions, Hold 5 Seconds, Perform 1 Times a Day   ASSESSMENT:   CLINICAL IMPRESSION: Patient arrives with continued low back and left LE pain/paresthesia and significant headache at the base of her scull. Patient kept more upright today due to the headache. Patient educated about dry needling and initiated STM at lumbar spine region with plan to consider dry needling next session. Patient did not have significant relief from any exercise and had worsening left LE symptoms with repeated lumbar extension in standing. She also had sharp pain in her left eye while during forwards lumbar flexion  roll out which necessitated discontinuation of that exercise. Eye pain eased to throbbing afterwards. She tolerated sciatic nerve glide well so this was added to HEP. Patient very tender to STM at left glute region.  She reports feeling a little worse by the end of the session. She feels worse in the back and neck. Patient would benefit from continued management of limiting condition by skilled physical therapist to address remaining impairments and functional limitations to work towards stated goals and return to PLOF or  maximal functional independence.   From Initial PT Eval 01/18/2023: Patient is a 52 y.o. female referred to outpatient physical therapy with a medical diagnosis of lumbar degenerative disc disease, lumbar radiculitis who presents with signs and symptoms consistent with left sided lumbar low back pain with radiculopathy throughout L LE in the setting of history of chiari malformation with concurrent L UE, L neck, and left face pain and paresthesia. Patient presents with significant ROM, pain, altered sensation, paresthesia, motor control, neurologic weakness, neurodynamic, muscle performance (strength/power/endurance), gait, balance, and activity tolerance impairments that are limiting ability to complete activities that require standing and walking, household and community mobility, going into store/shopping, housework, cooking, carrying, sleeping, dressing, bathing, working, prolonged sitting without difficulty. Unable to rule out more proximal contribution from chiari malformation to symptoms. Patient with elevated irritability today and exam was limited. Plan to further screen UMN next session. Patient will benefit from skilled physical therapy intervention to address current body structure impairments and activity limitations to improve function and work towards goals set in current POC in order to return to prior level of function or maximal functional improvement.      OBJECTIVE IMPAIRMENTS: Abnormal gait, decreased activity tolerance, decreased balance, decreased endurance, decreased knowledge of use of DME, decreased mobility, difficulty walking, decreased ROM, decreased strength, hypomobility, impaired perceived functional ability, impaired sensation, improper body mechanics, obesity, and pain.    ACTIVITY LIMITATIONS: carrying, lifting, bending, sitting, standing, squatting, sleeping, stairs, transfers, bed mobility, continence, bathing, dressing, and locomotion level   PARTICIPATION LIMITATIONS:  meal prep, cleaning, laundry, interpersonal relationship, shopping, community activity, occupation, yard work, and   difficulty activities that require standing and walking, household and community mobility, going into store/shopping, housework, cooking, carrying, sleeping, dressing, bathing, working, prolonged sitting   PERSONAL FACTORS: Fitness, Past/current experiences, Time since onset of injury/illness/exacerbation, and 3+ comorbidities:   idiopathic intracranial hypertension (IIH) s/p stenting right 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    REHAB POTENTIAL: Good   CLINICAL DECISION MAKING: Evolving/moderate complexity   EVALUATION COMPLEXITY: Moderate     GOALS: Goals reviewed with patient? No   SHORT TERM GOALS: Target date: 02/01/2023   Patient will be independent with initial home exercise program for self-management of symptoms. Baseline: Initial HEP provided at IE (01/18/23); Goal status: MET     LONG TERM GOALS: Target date: 04/12/2023   Patient will be independent with a long-term home exercise program for self-management of symptoms.  Baseline: Initial HEP provided at IE (01/18/23); Goal status: In-progress   2.  Patient will demonstrate improved FOTO to equal or greater than 52 by visit #12 to demonstrate improvement in overall condition and self-reported functional ability.  Baseline: 36 (01/18/23); Goal status: In-progress   3.  Patient will ambulate 1800 feet on 6 Minute Walk Test with LRAD to progress towards age matched norms and improve her community ambulation. Baseline: to be tested visit 2 as appropriate (01/18/23); 520 feet  with SPC in R UE (01/21/2023); Goal status: In-progress   4.  Patient will demonstrate L LE strength of equal or greater than 4+/5 in hip, ankle, and knee motions with no increase in pain to improve fall risk and ability to complete ADLs and IADLs safely and with less difficulty.  Baseline: limited and painful (3-4/5 MMT with pain) see objective exam above (01/18/23); Goal status: In-progress   5.  Patient will complete community, work and/or recreational activities with 50% less limitation due to current condition.  Baseline: difficulty with activities that require standing and walking, household and community mobility, going into store/shopping, housework, cooking, carrying, sleeping, dressing, bathing, working, prolonged sitting (01/18/23); Goal status: In-progress       PLAN:   PT FREQUENCY: 1-2x/week   PT DURATION: 12 weeks   PLANNED INTERVENTIONS: Therapeutic exercises, Therapeutic activity, Neuromuscular re-education, Balance training, Gait training, Patient/Family education, Self Care, Joint mobilization, Stair training, DME instructions, Dry Needling, Electrical stimulation, Spinal mobilization, Cryotherapy, Moist heat, Manual therapy, and Re-evaluation.   PLAN FOR NEXT SESSION: update HEP as appropriate, progressive core/LE/functional strengthening, ROM, motor control, and balance exercises as tolerated, manual therapy as needed, neurodynamic exercises tolerated.     Cira Rue, PT, DPT 01/26/2023, 10:42 AM  [facility] [address] [place], [place] [address] P: [phone] I F: [phone]

## 2023-01-28 ENCOUNTER — Emergency Department: Payer: Managed Care, Other (non HMO)

## 2023-01-28 ENCOUNTER — Emergency Department
Admission: EM | Admit: 2023-01-28 | Discharge: 2023-01-28 | Disposition: A | Discharge status: Home / Self Care | Payer: Managed Care, Other (non HMO) | Attending: Emergency Medicine | Admitting: Emergency Medicine

## 2023-01-28 ENCOUNTER — Other Ambulatory Visit: Payer: Self-pay

## 2023-01-28 DIAGNOSIS — R519 Headache, unspecified: Secondary | ICD-10-CM | POA: Insufficient documentation

## 2023-01-28 DIAGNOSIS — M5416 Radiculopathy, lumbar region: Secondary | ICD-10-CM

## 2023-01-28 DIAGNOSIS — F17210 Nicotine dependence, cigarettes, uncomplicated: Secondary | ICD-10-CM | POA: Diagnosis not present

## 2023-01-28 MED ORDER — KETOROLAC TROMETHAMINE 15 MG/ML IJ SOLN: 15.0000 mg | Freq: Once | INTRAMUSCULAR | Status: DC

## 2023-01-28 MED ORDER — HYDROMORPHONE HCL 1 MG/ML IJ SOLN
1.0000 mg | Freq: Once | INTRAMUSCULAR | Status: AC
  Administered 2023-01-28: 1 mg via INTRAVENOUS
  Filled 2023-01-28: qty 1

## 2023-01-28 MED ORDER — METOCLOPRAMIDE HCL 5 MG/ML IJ SOLN
10.0000 mg | Freq: Once | INTRAMUSCULAR | Status: AC
  Administered 2023-01-28: 10 mg via INTRAVENOUS
  Filled 2023-01-28: qty 2

## 2023-01-28 NOTE — ED Triage Notes (Signed)
Pt to ED for left sided body pain started this am. States sharp pain in left hip/back radiating down left leg. Ambulatory with cane. NAD noted

## 2023-01-28 NOTE — Discharge Instructions (Signed)
Your CAT scan today did not have any acute findings.  I suspect your pain is due to pinched nerve in your back.  Please continue to take your medications as prescribed and follow-up with physical medication orthopedics and your neurologist.  You develop any new neurologic symptoms that are concerning to you then please return to the emergency department.

## 2023-01-28 NOTE — ED Triage Notes (Signed)
First Nurse Note: C/O left sided "tingling" since yesterday. State this morning pain started from left side radiating down to left thigh.  Describes pain as burning pain.  AAOx3.  Skin warm and dry. NAD.

## 2023-01-28 NOTE — ED Provider Notes (Signed)
[facility] Provider Note    Event Date/Time   First MD Initiated Contact with Patient 01/28/23 [phone]     (approximate)   History   No chief complaint on file.   HPI  Dominique Estes is a 52 y.o. 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  Patient tells me that for several years now she has had chronic pain in the left side of her body starts in her head and radiates from her cervical spine down to her leg.  She has pain in the left side of her head behind the left eye as well as in her left side of her neck down her arm and from the left anterior thigh down to her feet.  Feels like a tingling burning sensation.  Denies weakness.  No bowel bladder incontinence.  No fever or chills.  She tells me that pain is worse this morning but is similar to her typical pain.  She had been on gabapentin in the past we will switch to something different she is not sure which it is.  Does see physical medicine.  Note from 12/31/2022 from physical medicine describes similar pain to patient is describing today.  She has had epidural injection.  Had EMGs as well as MRI of the C and L-spine.      Past Medical History:  Diagnosis Date   Carpal tunnel syndrome of right wrist 2004   Chiari malformation type I    History of kidney stones    age15    Patient Active Problem List   Diagnosis Date Noted   Chiari I malformation 09/23/2021   Obesity BMI=42.5 04/09/2021   Smoker 1/2-1 ppd 04/09/2021   Uterine bleeding 04/09/2021     Physical Exam  Triage Vital Signs: ED Triage Vitals  Enc Vitals Group     BP 01/28/23 0820 (!) 140/85     Pulse Rate 01/28/23 0820 85     Resp 01/28/23 0820 19     Temp 01/28/23 0820 98.4 F (36.9 C)     Temp Source 01/28/23 0820 Oral     SpO2 01/28/23 0820 100 %      Weight 01/28/23 0829 255 lb (115.7 kg)     Height 01/28/23 0829 5\' 5"  (1.651 m)     Head Circumference --      Peak Flow --      Pain Score 01/28/23 0829 8     Pain Loc --      Pain Edu? --      Excl. in GC? --     Most recent vital signs: Vitals:   01/28/23 0820  BP: (!) 140/85  Pulse: 85  Resp: 19  Temp: 98.4 F (36.9 C)  SpO2: 100%     General: Awake, no distress.  CV:  Good peripheral perfusion.  Resp:  Normal effort.  Abd:  No distention.  Neuro:             Awake, Alert, Oriented x 3  Other:  Aox3, nml speech  PERRL, EOMI, face symmetric, nml tongue movement  Patient has tenderness to palpation over the midline and paraspinal cervical spine musculature No pronator drift 5 out of 5 strength with knee flexion, knee extension, 4-5 with plantarflexion, 5-5 with dorsiflexion on the left compared to 5 out of 5 in all on the  right Sensation grossly intact in bilateral upper and lower extremities   ED Results / Procedures / Treatments  Labs (all labs ordered are listed, but only abnormal results are displayed) Labs Reviewed - No data to display   EKG     RADIOLOGY    PROCEDURES:  Critical Care performed: No  Procedures  The patient is on the cardiac monitor to evaluate for evidence of arrhythmia and/or significant heart rate changes.   MEDICATIONS ORDERED IN ED: Medications  metoCLOPramide (REGLAN) injection 10 mg (10 mg Intravenous Given 01/28/23 1000)  HYDROmorphone (DILAUDID) injection 1 mg (1 mg Intravenous Given 01/28/23 1005)     IMPRESSION / MDM / ASSESSMENT AND PLAN / ED COURSE  I reviewed the triage vital signs and the nursing notes.                              Patient's presentation is most consistent with acute complicated illness / injury requiring diagnostic workup.  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  Tells me she has a history of similar for many years typically involves left side of her face arm and leg.  She does see physical medicine as well as orthopedics and neurology regarding all of this.  Pain today is worse than her typical pain but similar in quality and character.  Describes a tingling burning sensation that involves her whole arm from her down to her fingers as well as the same sensation in her anterior leg down to her toes.  She also endorses swelling behind her eye and pain in the left side of her face as well but no vision change.  Denies any weakness.  No bowel bladder incontinence no fevers.  She is not sure which pain medication she is taking.   On exam overall she does look somewhat uncomfortable with movement but nontoxic.  Cranial nerves are intact and she has no evidence of weakness in the upper extremity.  Does have some weakness with plantarflexion in the left lower extremity but knee flexion extension are intact, hip flexion strength testing is limited secondary to pain.  When I test sensation she says it feels different but it feels like there is increased tingling in the left upper and lower extremity compared to the right not a lack of sensation.  Overall my suspicion for acute life-threatening process causing her symptoms is low given that she has had very similar symptoms for some time and has had recent MRI brain and L-spine and C-spine.  Given her history we will obtain a CT head out of precaution.  Will give IV Dilaudid and Reglan for pain.  CT head is no acute findings.  After Dilaudid and Reglan patient is feeling improved.  She is resting comfortably.  This time I think she can safely be discharged.  I  did recommend she follow-up with her urologist Ortho and physical medicine.  Not make any changes to her medications that she is on gabapentin nortriptyline and Fioricet and given these are chronic issues I would like to avoid opiates.       FINAL CLINICAL IMPRESSION(S) /  ED DIAGNOSES   Final diagnoses:  Lumbar radiculopathy  Nonintractable headache, unspecified chronicity pattern, unspecified headache type     Rx / DC Orders   ED Discharge Orders     None        Note:  This document was prepared using Dragon voice recognition software and may include unintentional dictation errors.   Georga HackingMcHugh, Lida Berkery Rose, MD 01/28/23 [phone]

## 2023-02-02 ENCOUNTER — Ambulatory Visit: Payer: Managed Care, Other (non HMO) | Admitting: Physical Therapy

## 2023-02-03 ENCOUNTER — Encounter: Payer: Self-pay | Admitting: Physical Therapy

## 2023-02-03 ENCOUNTER — Ambulatory Visit: Payer: Managed Care, Other (non HMO) | Admitting: Physical Therapy

## 2023-02-03 DIAGNOSIS — M5416 Radiculopathy, lumbar region: Secondary | ICD-10-CM

## 2023-02-03 DIAGNOSIS — M6281 Muscle weakness (generalized): Secondary | ICD-10-CM

## 2023-02-03 DIAGNOSIS — M5459 Other low back pain: Secondary | ICD-10-CM

## 2023-02-03 DIAGNOSIS — R262 Difficulty in walking, not elsewhere classified: Secondary | ICD-10-CM

## 2023-02-03 NOTE — Therapy (Signed)
OUTPATIENT PHYSICAL THERAPY TREATMENT NOTE   Patient Name: Dominique Estes MRN: [id number] DOB:May 30, 1971, 52 y.o., female Today's Date: 02/03/2023  PCP: Barbette Reichmann, MD  REFERRING PROVIDER: Burman Freestone, FNP   END OF SESSION:   PT End of Session - 02/03/23 1830     Visit Number 4    Number of Visits 24    Date for PT Re-Evaluation 04/12/23    Authorization Type [employer] reporting period from 01/18/2023    Authorization Time Period VL: 60 PT/OT/ST- 60 remain    Authorization - Visit Number 4    Authorization - Number of Visits 60    Progress Note Due on Visit 10    PT Start Time 1350    PT Stop Time 1430    PT Time Calculation (min) 40 min    Activity Tolerance Patient limited by pain    Behavior During Therapy [facility] for tasks assessed/performed               Past Medical History:  Diagnosis Date   Carpal tunnel syndrome of right wrist 2004   Chiari malformation type I    History of kidney stones    age15   Past Surgical History:  Procedure Laterality Date   BRAIN SURGERY     carpel tunnel release Right 2004   COLPOSCOPY  03/23/2013   TOTAL LAPAROSCOPIC HYSTERECTOMY WITH SALPINGECTOMY Bilateral 06/25/2021   Procedure: TOTAL LAPAROSCOPIC HYSTERECTOMY WITH SALPINGECTOMY, CYSTOSCOPY;  Surgeon: Christeen Douglas, MD;  Location: [facility] ORS;  Service: Gynecology;  Laterality: Bilateral;   Patient Active Problem List   Diagnosis Date Noted   Chiari I malformation 09/23/2021   Obesity BMI=42.5 04/09/2021   Smoker 1/2-1 ppd 04/09/2021   Uterine bleeding 04/09/2021    REFERRING DIAG: lumbar degenerative disc disease, lumbar radiculitis   THERAPY DIAG:  Other low back pain  Radiculopathy, lumbar region  Muscle weakness (generalized)  Difficulty in walking, not elsewhere classified  Rationale for Evaluation and Treatment Rehabilitation  PERTINENT HISTORY: Patient is a 52 y.o. female who presents to outpatient physical therapy with a referral for medical  diagnosis lumbar degenerative disc disease, lumbar radiculitis. This patient's chief complaints consist of left sided low back pain and left LE pain, paresthesia, and weakness with concurrent L neck, head, and UE pain, paresthesia, and weakness leading to the following functional deficits: difficulty with activities that require standing and walking, household and community mobility, going into store/shopping, housework, cooking, carrying, sleeping, dressing, bathing, working, prolonged sitting. 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 Patient denies hx of cancer, stroke, seizures, lung problems, heart problems, diabetes, unexplained weight loss, and osteoporosis, spinal surgery outside of her cervical spine. She has had 2-3 times where she had to urinate but she did not make it in time and she found urine running down her leg, which surprised her, she does find herself stumbling and dropping things.   PRECAUTIONS: fall, don't lift anything more than 5# at least after the injection.   SUBJECTIVE:  SUBJECTIVE STATEMENT:  patient arrives with [facility] and reports she is feeling better today. She had a headache earlier today but took some medicating and it feels better now. She visited the ED on Thursday last week when her left sided pain from her head to her toe got worse and she had trouble moving her left leg. She was provided with a  Reglan and Dilaudid injection at the ED. She her PCP on Monday who prescribed her prednisone taper and  increased her Tompomax. He also referred her to Dr. Sherryll Burger for a second opinion for a second opinion for why she is continuing to have head pain. She saw the Rheumatologist for the first time yesterday. He did some blood work and Personal assistant and she has a follow up in 6 weeks.  She has had a couple of bad days since the ED visit but has been feeling a bit better since starting the prednisone.   PAIN:  Are you having pain? NPRS 5/10 across low back and down left leg to feet with tingling in the foot as well.     OBJECTIVE:   TODAY'S TREATMENT:  Therapeutic exercise: to centralize symptoms and improve ROM, strength, muscular endurance, and activity tolerance required for successful completion of functional activities.  - NuStep level 1 using bilateral upper and lower extremities. Seat/handle setting 9/9. For improved extremity mobility, muscular endurance, and activity tolerance; and to induce the analgesic effect of aerobic exercise, stimulate improved joint nutrition, and prepare body structures and systems for following interventions. x 7:30 min. Unable to continue with left UE due to pain/numbness. Average SPM = 61.  - seated lumbar flexion roll out with ball, 1x3 min self selected pace (limited with some left sided UE pain).  - seated sciatic nerve glide, slider technique, 1x15 each side.  (Manual therapy / dry needling - see below) - seated lumbar flexion roll out with ball, 1x1 min self selected pace (discontinued due to left sided discomfort in the thoracic region).  - sidelying modified left thoracic rotation with open book, (flexing L elbow to decrease pulling at left axillary region), 1x10 (improved lower thoracic pain).   Manual therapy: to reduce pain and tissue tension, improve range of motion, neuromodulation, in order to promote improved ability to complete functional activities. PRONE with  head in blue cradle, pillow under abdomen and ankles.  - STM to lumbar paraspinals and QL and lower thoracic paraspinals.   Modality: (unbilled) Dry needling performed to lumbar spine to decrease pain and spasms along patient's low back and leg region with patient in pronre utilizing 1 dry needle(s) .30mm x 60mm with 2 sticks at Left L4 lumbar multifidi and Iliocostalis at approx L4. Patient educated about the risks and benefits from therapy and verbally consents to treatment.  Dry needling performed by Luretha Murphy. Ilsa Iha PT, DPT who is certified in this technique.    Pt required multimodal cuing for proper technique and to facilitate improved neuromuscular control, strength, range of motion, and functional ability resulting in improved performance and form.   PATIENT EDUCATION:  Education details: Exercise purpose/form. Self management techniques. Education on diagnosis, prognosis, POC, anatomy and physiology of current condition Education on HEP including handout  Reviewed cancelation/no-show policy with patient and confirmed patient has correct phone number for clinic; patient verbalized understanding (01/21/23).  Person educated: Patient Education method: Explanation, Actor cues, Verbal cues, and Handouts Education comprehension: verbalized understanding, returned demonstration, and needs further education   HOME EXERCISE PROGRAM: Access Code: [email or web address] URL: [email or web address]  Date: 01/26/2023 Prepared by: Norton Blizzard  Exercises - Supine Lower Trunk Rotation  - 2 x daily - 1 sets - 20 reps - 1-5 seconds hold - Seated Slump Nerve Glide  - 2 x daily - 1 sets - 15 reps  HOME EXERCISE PROGRAM [BHEVYHD] View at "[email or web address]" using code: BHEVYHD Stability Ball (SB) Lumbar Flexion Stretch -  Repeat 25 Repetitions, Hold 5 Seconds, Perform 1 Times a Day   ASSESSMENT:   CLINICAL IMPRESSION: Patient arrives with significantly improved pain after starting prednisone  taper two days ago following weekend trip to the ED for worsening in symptoms she is seeking PT for. She continues to have difficulty tolerating most exercises including those that involve use of the left UE as it goes numb with use. Manual therapy and dry needling was utilized today, focusing on tender portions of left lumbar and thoracic paraspinal muscles. Patient found it quite uncomfortable during the needling but reported feeling better overall by end of session. She was most tender to palpation (and very tight) in the lower left thoracic paraspinals. She reported some improved pain after sidelying open book with left thoracic rotation and was verbally recommended to use this exercise at home for pain control. Patient would benefit from continued management of limiting condition by skilled physical therapist to address remaining impairments and functional limitations to work towards stated goals and return to PLOF or maximal functional independence.   From Initial PT Eval 01/18/2023: Patient is a 52 y.o. female referred to outpatient physical therapy with a medical diagnosis of lumbar degenerative disc disease, lumbar radiculitis who presents with signs and symptoms consistent with left sided lumbar low back pain with radiculopathy throughout L LE in the setting of history of chiari malformation with concurrent L UE, L neck, and left face pain and paresthesia. Patient presents with significant ROM, pain, altered sensation, paresthesia, motor control, neurologic weakness, neurodynamic, muscle performance (strength/power/endurance), gait, balance, and activity tolerance impairments that are limiting ability to complete activities that require standing and walking, household and community mobility, going into store/shopping, housework, cooking, carrying, sleeping, dressing, bathing, working, prolonged sitting without difficulty. Unable to rule out more proximal contribution from chiari malformation to symptoms.  Patient with elevated irritability today and exam was limited. Plan to further screen [facility] next session. Patient will benefit from skilled physical therapy intervention to address current body structure impairments and activity limitations to improve function and work towards goals set in current POC in order to return to prior level of function or maximal functional improvement.      OBJECTIVE IMPAIRMENTS: Abnormal gait, decreased activity tolerance, decreased balance, decreased endurance, decreased knowledge of use of DME, decreased mobility, difficulty walking, decreased ROM, decreased strength, hypomobility, impaired perceived functional ability, impaired sensation, improper body mechanics, obesity, and pain.    ACTIVITY LIMITATIONS: carrying, lifting, bending, sitting, standing, squatting, sleeping, stairs, transfers, bed mobility, continence, bathing, dressing, and locomotion level   PARTICIPATION LIMITATIONS: meal prep, cleaning, laundry, interpersonal relationship, shopping, community activity, occupation, yard work, and   difficulty activities that require standing and walking, household and community mobility, going into store/shopping, housework, cooking, carrying, sleeping, dressing, bathing, working, prolonged sitting   PERSONAL FACTORS: Fitness, Past/current experiences, Time since onset of injury/illness/exacerbation, and 3+ comorbidities:   idiopathic intracranial hypertension (IIH) s/p stenting right transverse to sigmoid sinus venous stenosis 12-03-22, B/L tegmen tympani meningocele, left foramen ovale encephalocele and chiari s/p decompression (craniotomy and resection of posterior arch of C1) 12/26/21, left sided paresthesia throughout left neck/face, L  UE, L LE, headaches, current smoker, R CTM (release 06/2003), hysterectomy are also affecting patient's functional outcome.    REHAB POTENTIAL: Good   CLINICAL DECISION MAKING: Evolving/moderate complexity   EVALUATION COMPLEXITY:  Moderate     GOALS: Goals reviewed with patient? No   SHORT TERM GOALS: Target date: 02/01/2023   Patient will be independent with initial home exercise program for self-management of symptoms. Baseline: Initial HEP provided at IE (01/18/23); Goal status: MET     LONG TERM GOALS: Target date: 04/12/2023   Patient will be independent with a long-term home exercise program for self-management of symptoms.  Baseline: Initial HEP provided at IE (01/18/23); Goal status: In-progress   2.  Patient will demonstrate improved FOTO to equal or greater than 52 by visit #12 to demonstrate improvement in overall condition and self-reported functional ability.  Baseline: 36 (01/18/23); Goal status: In-progress   3.  Patient will ambulate 1800 feet on 6 Minute Walk Test with LRAD to progress towards age matched norms and improve her community ambulation. Baseline: to be tested visit 2 as appropriate (01/18/23); 520 feet with SPC in R UE (01/21/2023); Goal status: In-progress   4.  Patient will demonstrate L LE strength of equal or greater than 4+/5 in hip, ankle, and knee motions with no increase in pain to improve fall risk and ability to complete ADLs and IADLs safely and with less difficulty.  Baseline: limited and painful (3-4/5 MMT with pain) see objective exam above (01/18/23); Goal status: In-progress   5.  Patient will complete community, work and/or recreational activities with 50% less limitation due to current condition.  Baseline: difficulty with activities that require standing and walking, household and community mobility, going into store/shopping, housework, cooking, carrying, sleeping, dressing, bathing, working, prolonged sitting (01/18/23); Goal status: In-progress       PLAN:   PT FREQUENCY: 1-2x/week   PT DURATION: 12 weeks   PLANNED INTERVENTIONS: Therapeutic exercises, Therapeutic activity, Neuromuscular re-education, Balance training, Gait training, Patient/Family  education, Self Care, Joint mobilization, Stair training, DME instructions, Dry Needling, Electrical stimulation, Spinal mobilization, Cryotherapy, Moist heat, Manual therapy, and Re-evaluation.   PLAN FOR NEXT SESSION: update HEP as appropriate, progressive core/LE/functional strengthening, ROM, motor control, and balance exercises as tolerated, manual therapy as needed, neurodynamic exercises tolerated.     Cira Rue, PT, DPT 02/03/2023, 6:39 PM  [facility] [address] [place], [place] [address] P: [phone] I F: [phone]

## 2023-02-04 ENCOUNTER — Ambulatory Visit: Payer: Managed Care, Other (non HMO) | Admitting: Physical Therapy

## 2023-02-09 ENCOUNTER — Ambulatory Visit: Payer: Managed Care, Other (non HMO) | Admitting: Physical Therapy

## 2023-02-09 ENCOUNTER — Encounter: Payer: Managed Care, Other (non HMO) | Admitting: Physical Therapy

## 2023-02-10 ENCOUNTER — Ambulatory Visit: Payer: Managed Care, Other (non HMO) | Admitting: Physical Therapy

## 2023-02-11 ENCOUNTER — Ambulatory Visit: Payer: Managed Care, Other (non HMO) | Admitting: Physical Therapy

## 2023-02-11 ENCOUNTER — Encounter: Payer: Self-pay | Admitting: Physical Therapy

## 2023-02-11 DIAGNOSIS — M5416 Radiculopathy, lumbar region: Secondary | ICD-10-CM

## 2023-02-11 DIAGNOSIS — M5459 Other low back pain: Secondary | ICD-10-CM

## 2023-02-11 DIAGNOSIS — M6281 Muscle weakness (generalized): Secondary | ICD-10-CM

## 2023-02-11 DIAGNOSIS — R262 Difficulty in walking, not elsewhere classified: Secondary | ICD-10-CM

## 2023-02-11 NOTE — Therapy (Signed)
OUTPATIENT PHYSICAL THERAPY TREATMENT NOTE   Patient Name: Dominique Estes MRN: [id number] DOB:09-08-1971, 52 y.o., female Today's Date: 02/11/2023  PCP: Barbette Reichmann, MD  REFERRING PROVIDER: Burman Freestone, FNP   END OF SESSION:   PT End of Session - 02/11/23 0865     Visit Number 5    Number of Visits 24    Date for PT Re-Evaluation 04/12/23    Authorization Type [employer] reporting period from 01/18/2023    Authorization Time Period VL: 60 PT/OT/ST- 60 remain    Authorization - Visit Number 5    Authorization - Number of Visits 60    Progress Note Due on Visit 10    PT Start Time 0901    PT Stop Time 0940    PT Time Calculation (min) 39 min    Activity Tolerance Patient limited by pain    Behavior During Therapy [facility] for tasks assessed/performed              Past Medical History:  Diagnosis Date   Carpal tunnel syndrome of right wrist 2004   Chiari malformation type I    History of kidney stones    age15   Past Surgical History:  Procedure Laterality Date   BRAIN SURGERY     carpel tunnel release Right 2004   COLPOSCOPY  03/23/2013   TOTAL LAPAROSCOPIC HYSTERECTOMY WITH SALPINGECTOMY Bilateral 06/25/2021   Procedure: TOTAL LAPAROSCOPIC HYSTERECTOMY WITH SALPINGECTOMY, CYSTOSCOPY;  Surgeon: Christeen Douglas, MD;  Location: [facility] ORS;  Service: Gynecology;  Laterality: Bilateral;   Patient Active Problem List   Diagnosis Date Noted   Chiari I malformation 09/23/2021   Obesity BMI=42.5 04/09/2021   Smoker 1/2-1 ppd 04/09/2021   Uterine bleeding 04/09/2021    REFERRING DIAG: lumbar degenerative disc disease, lumbar radiculitis   THERAPY DIAG:  Other low back pain  Radiculopathy, lumbar region  Muscle weakness (generalized)  Difficulty in walking, not elsewhere classified  Rationale for Evaluation and Treatment Rehabilitation  PERTINENT HISTORY: Patient is a 52 y.o. female who presents to outpatient physical therapy with a referral for medical  diagnosis lumbar degenerative disc disease, lumbar radiculitis. This patient's chief complaints consist of left sided low back pain and left LE pain, paresthesia, and weakness with concurrent L neck, head, and UE pain, paresthesia, and weakness leading to the following functional deficits: difficulty with activities that require standing and walking, household and community mobility, going into store/shopping, housework, cooking, carrying, sleeping, dressing, bathing, working, prolonged sitting. 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 Patient denies hx of cancer, stroke, seizures, lung problems, heart problems, diabetes, unexplained weight loss, and osteoporosis, spinal surgery outside of her cervical spine. She has had 2-3 times where she had to urinate but she did not make it in time and she found urine running down her leg, which surprised her, she does find herself stumbling and dropping things.   PRECAUTIONS: fall, don't lift anything more than 5# at least after the injection.   SUBJECTIVE:  SUBJECTIVE STATEMENT:  patient arrives with [facility] and states she is not doing well today. She states she woke up with worse pain in her back and neck. She woke up at 5am and was tossing and turning. The tingling and the numbness was in her left arm. Her glute and back has been hurting more since Sunday. She has not heard about her second  opinion with the neurologist. She is going back to see [employer] on 02/16/2023. She states at times she feels like PT is helping her and at other times it feels like she is "back at that hurting stage." She states she had increased pain after dry needling for 2 days and  no relief. She states when she feels dizzy she feels like the room is getting ready to spin. She denies feeling like she is going to pass out.   PAIN:  Are you having pain? NPRS 9/10 in the left glute and across the low back, 8/10 in the neck and down left arm.    OBJECTIVE  SELF-REPORTED FUNCTION FOTO score: 36/100 (low back questionnaire)      TODAY'S TREATMENT:  Therapeutic exercise: to centralize symptoms and improve ROM, strength, muscular endurance, and activity tolerance required for successful completion of functional activities.  - NuStep level 1 using bilateral upper and lower extremities. Seat/handle setting 9/9. For improved extremity mobility, muscular endurance, and activity tolerance; and to induce the analgesic effect of aerobic exercise, stimulate improved joint nutrition, and prepare body structures and systems for following interventions. x 7:30 min. Unable to continue with left UE due to pain/numbness. Average SPM = 61.  (Manual therapy - see below) - Sidelying open book (thoracic rotation) to improve thoracic, shoulder girdle, and upper trunk mobility. 2x10 each side. Modified with elbow bent when rotating left. Better tolerated rotating right.  - seated cervical retraction with upper cervical spine flexion AROM, 1x10 with 5 second holds.   Manual therapy: to reduce pain and tissue tension, improve range of motion, neuromodulation, in order to promote improved ability to complete functional activities. HOOKLYING (unable to tolerate with wedge under lower legs, better with bolster under knees only).  - very very gentle STM to posterior cervical spine musculature, had difficulty tolerating just more  than light touch at suboccipital region and over CT junction, able to tolerate slightly more pressure at B UT and mid cervical spine. Discontinued due to lack of tolerance. Patient reports her left eye feels like it is jumping when PT palpates suboccipital region and less so when palpating UT.  LEFT SIDELYING - gentle STM with and without "the stick" assist over left glute region. Unable to tolerate more than light pressure, discontinued.    Pt required multimodal cuing for proper technique and to facilitate improved neuromuscular control, strength, range of motion, and functional ability resulting in improved performance and form.   PATIENT EDUCATION:  Education details: Exercise purpose/form. Self management techniques. Education on diagnosis, prognosis, POC, anatomy and physiology of current condition Education on HEP including handout  Reviewed cancelation/no-show policy with patient and confirmed patient has correct phone number for clinic; patient verbalized understanding (01/21/23).  Person educated: Patient Education method: Explanation, Actor cues, Verbal cues, and Handouts Education comprehension: verbalized understanding, returned demonstration, and needs further education   HOME EXERCISE PROGRAM: Access Code: [email or web address] URL: [email or web address] Date: 01/26/2023 Prepared by: Norton Blizzard  Exercises - Supine Lower Trunk Rotation  - 2 x daily - 1 sets - 20 reps - 1-5 seconds hold - Seated Slump Nerve  Glide  - 2 x daily - 1 sets - 15 reps  HOME EXERCISE PROGRAM [BHEVYHD] View at [email or web address]" using code: BHEVYHD Stability Ball (SB) Lumbar Flexion Stretch -  Repeat 25 Repetitions, Hold 5 Seconds, Perform 1 Times a Day   ASSESSMENT:   CLINICAL IMPRESSION: Patient arrives reporting elevated pain and poor response to dry needling. She is wearing sunglasses (as she has at previous visits) and states this is due to photosensitivity. She also reports feeling  dizzy and unsteady with many normal daily tasks. Pateint unable to tolerate even light STM at posterior neck muscles (worst at suboccipital region and CT junction) or left glute. Patient reports her left eye feels like it is jumping when PT palpates suboccipital region and less so when palpating UT. Patient also reports dizziness described as spinning and scores moderate severity with dizziness with many items on the Visual Vertigo Analogue Scale, which suggests she may be suffering from vestibular problems as well. So far, patient is not tolerating PT well or showing significant improvements. She would likely benefit from further medical assessment to better address her deficits. Plan to complete one more PT visit before she sees her referring provider next week. If no significant changes at that point, plan to not schedule more PT sessions until she has followed up with referring provider. Patient would benefit from continued management of limiting condition by skilled physical therapist to address remaining impairments and functional limitations to work towards stated goals and return to PLOF or maximal functional independence.   From Initial PT Eval 01/18/2023: Patient is a 52 y.o. female referred to outpatient physical therapy with a medical diagnosis of lumbar degenerative disc disease, lumbar radiculitis who presents with signs and symptoms consistent with left sided lumbar low back pain with radiculopathy throughout L LE in the setting of history of chiari malformation with concurrent L UE, L neck, and left face pain and paresthesia. Patient presents with significant ROM, pain, altered sensation, paresthesia, motor control, neurologic weakness, neurodynamic, muscle performance (strength/power/endurance), gait, balance, and activity tolerance impairments that are limiting ability to complete activities that require standing and walking, household and community mobility, going into store/shopping, housework,  cooking, carrying, sleeping, dressing, bathing, working, prolonged sitting without difficulty. Unable to rule out more proximal contribution from chiari malformation to symptoms. Patient with elevated irritability today and exam was limited. Plan to further screen [facility] next session. Patient will benefit from skilled physical therapy intervention to address current body structure impairments and activity limitations to improve function and work towards goals set in current POC in order to return to prior level of function or maximal functional improvement.      OBJECTIVE IMPAIRMENTS: Abnormal gait, decreased activity tolerance, decreased balance, decreased endurance, decreased knowledge of use of DME, decreased mobility, difficulty walking, decreased ROM, decreased strength, hypomobility, impaired perceived functional ability, impaired sensation, improper body mechanics, obesity, and pain.    ACTIVITY LIMITATIONS: carrying, lifting, bending, sitting, standing, squatting, sleeping, stairs, transfers, bed mobility, continence, bathing, dressing, and locomotion level   PARTICIPATION LIMITATIONS: meal prep, cleaning, laundry, interpersonal relationship, shopping, community activity, occupation, yard work, and   difficulty activities that require standing and walking, household and community mobility, going into store/shopping, housework, cooking, carrying, sleeping, dressing, bathing, working, prolonged sitting   PERSONAL FACTORS: Fitness, Past/current experiences, Time since onset of injury/illness/exacerbation, and 3+ comorbidities:   idiopathic intracranial hypertension (IIH) s/p stenting right transverse to sigmoid sinus venous stenosis 12-03-22, B/L tegmen tympani meningocele, left foramen ovale encephalocele and chiari s/p  decompression (craniotomy and resection of posterior arch of C1) 12/26/21, left sided paresthesia throughout left neck/face, L UE, L LE, headaches, current smoker, R CTM (release 06/2003),  hysterectomy are also affecting patient's functional outcome.    REHAB POTENTIAL: Good   CLINICAL DECISION MAKING: Evolving/moderate complexity   EVALUATION COMPLEXITY: Moderate     GOALS: Goals reviewed with patient? No   SHORT TERM GOALS: Target date: 02/01/2023   Patient will be independent with initial home exercise program for self-management of symptoms. Baseline: Initial HEP provided at IE (01/18/23); Goal status: MET     LONG TERM GOALS: Target date: 04/12/2023   Patient will be independent with a long-term home exercise program for self-management of symptoms.  Baseline: Initial HEP provided at IE (01/18/23); Goal status: In-progress   2.  Patient will demonstrate improved FOTO to equal or greater than 52 by visit #12 to demonstrate improvement in overall condition and self-reported functional ability.  Baseline: 36 (01/18/23); Goal status: In-progress   3.  Patient will ambulate 1800 feet on 6 Minute Walk Test with LRAD to progress towards age matched norms and improve her community ambulation. Baseline: to be tested visit 2 as appropriate (01/18/23); 520 feet with SPC in R UE (01/21/2023); Goal status: In-progress   4.  Patient will demonstrate L LE strength of equal or greater than 4+/5 in hip, ankle, and knee motions with no increase in pain to improve fall risk and ability to complete ADLs and IADLs safely and with less difficulty.  Baseline: limited and painful (3-4/5 MMT with pain) see objective exam above (01/18/23); Goal status: In-progress   5.  Patient will complete community, work and/or recreational activities with 50% less limitation due to current condition.  Baseline: difficulty with activities that require standing and walking, household and community mobility, going into store/shopping, housework, cooking, carrying, sleeping, dressing, bathing, working, prolonged sitting (01/18/23); Goal status: In-progress       PLAN:   PT FREQUENCY: 1-2x/week    PT DURATION: 12 weeks   PLANNED INTERVENTIONS: Therapeutic exercises, Therapeutic activity, Neuromuscular re-education, Balance training, Gait training, Patient/Family education, Self Care, Joint mobilization, Stair training, DME instructions, Dry Needling, Electrical stimulation, Spinal mobilization, Cryotherapy, Moist heat, Manual therapy, and Re-evaluation.   PLAN FOR NEXT SESSION: update HEP as appropriate, progressive core/LE/functional strengthening, ROM, motor control, and balance exercises as tolerated, manual therapy as needed, neurodynamic exercises tolerated.     Cira Rue, PT, DPT 02/11/2023, 12:24 PM  [facility] [address] [place], [place] [address] P: [phone] I F: [phone]

## 2023-02-15 ENCOUNTER — Encounter: Payer: Self-pay | Admitting: Physical Therapy

## 2023-02-15 ENCOUNTER — Ambulatory Visit: Payer: Managed Care, Other (non HMO) | Admitting: Physical Therapy

## 2023-02-15 DIAGNOSIS — M5459 Other low back pain: Secondary | ICD-10-CM

## 2023-02-15 DIAGNOSIS — R262 Difficulty in walking, not elsewhere classified: Secondary | ICD-10-CM

## 2023-02-15 DIAGNOSIS — M6281 Muscle weakness (generalized): Secondary | ICD-10-CM

## 2023-02-15 DIAGNOSIS — M5416 Radiculopathy, lumbar region: Secondary | ICD-10-CM

## 2023-02-15 NOTE — Therapy (Signed)
OUTPATIENT PHYSICAL THERAPY TREATMENT NOTE   Patient Name: Dominique Estes MRN: [id number] DOB:1971-05-07, 52 y.o., female Today's Date: 02/15/2023  PCP: Barbette Reichmann, MD  REFERRING PROVIDER: Burman Freestone, FNP   END OF SESSION:   PT End of Session - 02/15/23 1357     Visit Number 6    Number of Visits 24    Date for PT Re-Evaluation 04/12/23    Authorization Type [employer] reporting period from 01/18/2023    Authorization Time Period VL: 60 PT/OT/ST- 60 remain    Authorization - Visit Number 6    Authorization - Number of Visits 60    Progress Note Due on Visit 10    PT Start Time 1350    PT Stop Time 1428    PT Time Calculation (min) 38 min    Activity Tolerance Patient limited by pain    Behavior During Therapy [facility] for tasks assessed/performed               Past Medical History:  Diagnosis Date   Carpal tunnel syndrome of right wrist 2004   Chiari malformation type I (HCC)    History of kidney stones    age15   Past Surgical History:  Procedure Laterality Date   BRAIN SURGERY     carpel tunnel release Right 2004   COLPOSCOPY  03/23/2013   TOTAL LAPAROSCOPIC HYSTERECTOMY WITH SALPINGECTOMY Bilateral 06/25/2021   Procedure: TOTAL LAPAROSCOPIC HYSTERECTOMY WITH SALPINGECTOMY, CYSTOSCOPY;  Surgeon: Christeen Douglas, MD;  Location: [facility] ORS;  Service: Gynecology;  Laterality: Bilateral;   Patient Active Problem List   Diagnosis Date Noted   Chiari I malformation (HCC) 09/23/2021   Obesity BMI=42.5 04/09/2021   Smoker 1/2-1 ppd 04/09/2021   Uterine bleeding 04/09/2021    REFERRING DIAG: lumbar degenerative disc disease, lumbar radiculitis   THERAPY DIAG:  Other low back pain  Radiculopathy, lumbar region  Muscle weakness (generalized)  Difficulty in walking, not elsewhere classified  Rationale for Evaluation and Treatment Rehabilitation  PERTINENT HISTORY: Patient is a 52 y.o. female who presents to outpatient physical therapy with a referral for  medical diagnosis lumbar degenerative disc disease, lumbar radiculitis. This patient's chief complaints consist of left sided low back pain and left LE pain, paresthesia, and weakness with concurrent L neck, head, and UE pain, paresthesia, and weakness leading to the following functional deficits: difficulty with activities that require standing and walking, household and community mobility, going into store/shopping, housework, cooking, carrying, sleeping, dressing, bathing, working, prolonged sitting. 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 Patient denies hx of cancer, stroke, seizures, lung problems, heart problems, diabetes, unexplained weight loss, and osteoporosis, spinal surgery outside of her cervical spine. She has had 2-3 times where she had to urinate but she did not make it in time and she found urine running down her leg, which surprised her, she does find herself stumbling and dropping things.   PRECAUTIONS: fall, don't lift anything more than 5# at least after the injection.   SUBJECTIVE:  SUBJECTIVE STATEMENT:  patient arrives with [facility] and states she is here. She states she just got off the phone with Whitney Meeler to ask if there is a better exam she can get. She sees Materials engineer. She states her head has been hurting too. She states she had a little pain but did not feel too bad after last PT session. She  has been seated lumbar flexion exercise pushing an ottoman forwards and backwards at home and that feels okay. She continues to wear sunglasses indoors due to photosensitivity.   PAIN:  Are you having pain? NPRS 7/10 in the whole left side.    OBJECTIVE  SELF-REPORTED FUNCTION   TODAY'S TREATMENT:  Therapeutic exercise: to centralize symptoms and improve ROM, strength, muscular endurance, and activity tolerance required for successful completion of functional activities.  - NuStep level 3 using bilateral upper and lower extremities. Seat/handle setting 9/9. For improved extremity mobility, muscular endurance, and activity tolerance; and to induce the analgesic effect of aerobic exercise, stimulate improved joint nutrition, and prepare body structures and systems for following interventions. X 5 min. Unable to continue entire time with left UE due to pain/numbness. Average SPM = 61.  - seated sciatic nerve glide, L LE only, 1x15 slider technique (very uncomfortable last few reps).  - seated lumbar flexion roll out with theraball, forwards and diagonals, self selected pace for 3 min. (Needed brief break due to feeling increased pressure at head).  - seated figure 4 piriformis stretch, 3x40-60 seconds each side (as tolerated).  - seated cat/cow 1x13 (tolerated if moving slowly) - Sidelying open book (thoracic rotation) to improve thoracic, shoulder girdle, and upper trunk mobility. 1x9-10 each side. Modified with elbow bent when rotating left. Better tolerated rotating right.  - Education on HEP including handout    Pt required multimodal cuing for proper technique and to facilitate improved neuromuscular control, strength, range of motion, and functional ability resulting in improved performance and form.   PATIENT EDUCATION:  Education details: Exercise purpose/form. Self management techniques. Education on diagnosis, prognosis, POC, anatomy and physiology of current condition Education on  HEP including handout. Advice for possible discharge.  Reviewed cancelation/no-show policy with patient and confirmed patient has correct phone number for clinic; patient verbalized understanding (01/21/23).  Person educated: Patient Education method: Explanation, Actor cues, Verbal cues, and Handouts Education comprehension: verbalized understanding, returned demonstration, and needs further education   HOME EXERCISE PROGRAM: Access Code: [email or web address] URL: [email or web address] Date: 01/26/2023 Prepared by: Norton Blizzard  Exercises - Supine Lower Trunk Rotation  - 2 x daily - 1 sets - 20 reps - 1-5 seconds hold - Seated Slump Nerve Glide  - 2 x daily - 1 sets - 15 reps  HOME EXERCISE PROGRAM [BHEVYHD] View at "[email or web address]" using code: BHEVYHD Stability Ball (SB) Lumbar Flexion Stretch -  Repeat 25 Repetitions, Hold 5 Seconds, Perform 1 Times a Day  HOME EXERCISE PROGRAM [QYKVXMD] View at "[email or web address]" using code: QYKVXMD Seated Cat Cow -  Repeat 10 Repetitions, Complete 1 Set, Perform 2 Times a Day   ASSESSMENT:   CLINICAL IMPRESSION: Patient arrives reporting no significant change in her condition or benefit from PT. Manual therapy avoided this session due to previous lack of tolerance. Today's session focused on exercises for core/LE/functional strength and ROM that were tolerated enough to perform. Patient continues to be limited by left sided pain and paresthesia from the face to the toes. Patient reports she feels the same by end of session. Patient educated on discharge recommendations  including exploring gradual increase in activity as tolerated over time. Patient to return to referring clinician tomorrow. At this point patient does not appear to be making progress in PT and PT recommends pursuing further medical work up.   From Initial PT Eval 01/18/2023: Patient is a 52 y.o. female referred to outpatient physical therapy with a medical diagnosis of  lumbar degenerative disc disease, lumbar radiculitis who presents with signs and symptoms consistent with left sided lumbar low back pain with radiculopathy throughout L LE in the setting of history of chiari malformation with concurrent L UE, L neck, and left face pain and paresthesia. Patient presents with significant ROM, pain, altered sensation, paresthesia, motor control, neurologic weakness, neurodynamic, muscle performance (strength/power/endurance), gait, balance, and activity tolerance impairments that are limiting ability to complete activities that require standing and walking, household and community mobility, going into store/shopping, housework, cooking, carrying, sleeping, dressing, bathing, working, prolonged sitting without difficulty. Unable to rule out more proximal contribution from chiari malformation to symptoms. Patient with elevated irritability today and exam was limited. Plan to further screen [facility] next session. Patient will benefit from skilled physical therapy intervention to address current body structure impairments and activity limitations to improve function and work towards goals set in current POC in order to return to prior level of function or maximal functional improvement.      OBJECTIVE IMPAIRMENTS: Abnormal gait, decreased activity tolerance, decreased balance, decreased endurance, decreased knowledge of use of DME, decreased mobility, difficulty walking, decreased ROM, decreased strength, hypomobility, impaired perceived functional ability, impaired sensation, improper body mechanics, obesity, and pain.    ACTIVITY LIMITATIONS: carrying, lifting, bending, sitting, standing, squatting, sleeping, stairs, transfers, bed mobility, continence, bathing, dressing, and locomotion level   PARTICIPATION LIMITATIONS: meal prep, cleaning, laundry, interpersonal relationship, shopping, community activity, occupation, yard work, and   difficulty activities that require standing and  walking, household and community mobility, going into store/shopping, housework, cooking, carrying, sleeping, dressing, bathing, working, prolonged sitting   PERSONAL FACTORS: Fitness, Past/current experiences, Time since onset of injury/illness/exacerbation, and 3+ comorbidities:   idiopathic intracranial hypertension (IIH) s/p stenting right 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    REHAB POTENTIAL: Good   CLINICAL DECISION MAKING: Evolving/moderate complexity   EVALUATION COMPLEXITY: Moderate     GOALS: Goals reviewed with patient? No   SHORT TERM GOALS: Target date: 02/01/2023   Patient will be independent with initial home exercise program for self-management of symptoms. Baseline: Initial HEP provided at IE (01/18/23); Goal status: MET     LONG TERM GOALS: Target date: 04/12/2023   Patient will be independent with a long-term home exercise program for self-management of symptoms.  Baseline: Initial HEP provided at IE (01/18/23); Goal status: In-progress   2.  Patient will demonstrate improved FOTO to equal or greater than 52 by visit #12 to demonstrate improvement in overall condition and self-reported functional ability.  Baseline: 36 (01/18/23); Goal status: In-progress   3.  Patient will ambulate 1800 feet on 6 Minute Walk Test with LRAD to progress towards age matched norms and improve her community ambulation. Baseline: to be tested visit 2 as appropriate (01/18/23); 520 feet with SPC in R UE (01/21/2023); Goal status: In-progress   4.  Patient will demonstrate  L LE strength of equal or greater than 4+/5 in hip, ankle, and knee motions  with no increase in pain to improve fall risk and ability to complete ADLs and IADLs safely and with less difficulty.  Baseline: limited and painful (3-4/5 MMT with pain) see objective exam above (01/18/23); Goal status: In-progress   5.  Patient will complete community, work and/or recreational activities with 50% less limitation due to current condition.  Baseline: difficulty with activities that require standing and walking, household and community mobility, going into store/shopping, housework, cooking, carrying, sleeping, dressing, bathing, working, prolonged sitting (01/18/23); Goal status: In-progress       PLAN:   PT FREQUENCY: 1-2x/week   PT DURATION: 12 weeks   PLANNED INTERVENTIONS: Therapeutic exercises, Therapeutic activity, Neuromuscular re-education, Balance training, Gait training, Patient/Family education, Self Care, Joint mobilization, Stair training, DME instructions, Dry Needling, Electrical stimulation, Spinal mobilization, Cryotherapy, Moist heat, Manual therapy, and Re-evaluation.   PLAN FOR NEXT SESSION: update HEP as appropriate, progressive core/LE/functional strengthening, ROM, motor control, and balance exercises as tolerated, manual therapy as needed, neurodynamic exercises tolerated.     Cira Rue, PT, DPT 02/15/2023, 3:49 PM  [facility] [address] [place], [place] [address] P: [phone] I F: [phone]

## 2023-02-16 ENCOUNTER — Encounter: Payer: Managed Care, Other (non HMO) | Admitting: Physical Therapy

## 2023-03-25 ENCOUNTER — Other Ambulatory Visit: Payer: Self-pay | Admitting: Internal Medicine

## 2023-03-25 DIAGNOSIS — K7689 Other specified diseases of liver: Secondary | ICD-10-CM

## 2023-03-25 DIAGNOSIS — R160 Hepatomegaly, not elsewhere classified: Secondary | ICD-10-CM

## 2023-04-13 ENCOUNTER — Other Ambulatory Visit: Payer: Managed Care, Other (non HMO)

## 2023-04-19 ENCOUNTER — Other Ambulatory Visit: Payer: Self-pay | Admitting: Internal Medicine

## 2023-04-19 DIAGNOSIS — Z1231 Encounter for screening mammogram for malignant neoplasm of breast: Secondary | ICD-10-CM

## 2023-04-23 ENCOUNTER — Other Ambulatory Visit: Payer: Managed Care, Other (non HMO)

## 2023-04-23 DIAGNOSIS — Z113 Encounter for screening for infections with a predominantly sexual mode of transmission: Secondary | ICD-10-CM

## 2023-04-23 DIAGNOSIS — Z114 Encounter for screening for human immunodeficiency virus [HIV]: Secondary | ICD-10-CM

## 2023-04-23 DIAGNOSIS — Z708 Other sex counseling: Secondary | ICD-10-CM

## 2023-04-23 LAB — HM HIV SCREENING LAB: HM HIV Screening: NEGATIVE

## 2023-04-23 NOTE — Progress Notes (Signed)
STI clinic/screening visit [address] [place] [address] [phone]  Subjective:  Dominique Estes is a 52 y.o. female being seen today for [facility]. The patient reports they do not have symptoms.    Patient's last menstrual period was 06/10/2021 (exact date).  Patient has the following medical conditions:   Patient Active Problem List   Diagnosis Date Noted   Chiari I malformation (HCC) 09/23/2021   Obesity BMI=42.5 04/09/2021   Smoker 1/2-1 ppd 04/09/2021   Uterine bleeding 04/09/2021    Chief Complaint  Patient presents with   SEXUALLY TRANSMITTED DISEASE    Screening.  Pt denies any symptoms    Does the patient using douching products? No  Last HIV test per patient/review of record was  Lab Results  Component Value Date   HMHIVSCREEN Negative - Validated 10/30/2022    Lab Results  Component Value Date   HIV Non Reactive 09/24/2021   Patient reports last pap was No results found for: "DIAGPAP"  Lab Results  Component Value Date   SPECADGYN Comment 09/24/2020    Screening for MPX risk: Does the patient have an unexplained rash? No Is the patient MSM? No Does the patient endorse multiple sex partners or anonymous sex partners? Yes Did the patient have close or sexual contact with a person diagnosed with MPX? No Has the patient traveled outside the [place] where MPX is endemic? No Is there a high clinical suspicion for MPX-- evidenced by one of the following No  -Unlikely to be chickenpox  -Lymphadenopathy  -Rash that present in same phase of evolution on any given body part See flowsheet for further details and programmatic requirements.   Immunization history:  Immunization History  Administered Date(s) Administered   [employer] Vaccination 06/02/2020, 06/30/2020   Tdap 01/19/2016, 04/18/2019     The following portions of the patient's history were reviewed and updated as appropriate: allergies, current medications,  past medical history, past social history, past surgical history and problem list.  Objective:  There were no vitals filed for this visit.  Patient seen by RN only. Self collected swabs.    Assessment and Plan:  Dominique Estes is a 52 y.o. female presenting to the [facility] Department for STI screening in [facility]  1. Screening for venereal disease - Chlamydia/Gonorrhea [place] [facility] - Syphilis Serology, [place]  2. Screening for human immunodeficiency virus - HIV [place] [facility]   Patient accepted all screenings including  vaginal CT/GC and bloodwork for HIV/RPR. Patient meets criteria for HepB screening? No. Ordered? no Patient meets criteria for HepC screening? No. Ordered? no  Treat positive results per standing order.  Discussed time line for [facility] results and that patient will be called with positive results and encouraged patient to call if she had not heard in 2 weeks.  Recommended repeat testing in 3 months with positive results. Recommended condom use with all sex  Patient is currently using Sterilization for Men and Women to prevent pregnancy.    Condoms given to patient.  No follow-ups on file.  No future appointments.  Berdie Ogren, RN

## 2023-05-05 ENCOUNTER — Telehealth: Payer: Self-pay | Admitting: Family Medicine

## 2023-05-05 NOTE — Telephone Encounter (Signed)
Please give me a call back regarding my test results on the 04/23/23

## 2023-05-05 NOTE — Telephone Encounter (Signed)
Password verified. Pt notified of negative Chlamydia, Syphilis and HIV test results from 7/5.  Berdie Ogren, RN

## 2023-05-17 ENCOUNTER — Ambulatory Visit: Payer: Managed Care, Other (non HMO) | Admitting: Pain Medicine

## 2023-05-25 ENCOUNTER — Ambulatory Visit
Admission: RE | Admit: 2023-05-25 | Discharge: 2023-05-25 | Disposition: A | Discharge status: Home / Self Care | Payer: 59 | Source: Ambulatory Visit | Attending: Internal Medicine | Admitting: Internal Medicine

## 2023-05-25 DIAGNOSIS — Z1231 Encounter for screening mammogram for malignant neoplasm of breast: Secondary | ICD-10-CM | POA: Insufficient documentation

## 2023-06-22 DIAGNOSIS — G932 Benign intracranial hypertension: Secondary | ICD-10-CM | POA: Diagnosis not present

## 2023-06-22 DIAGNOSIS — Q019 Encephalocele, unspecified: Secondary | ICD-10-CM | POA: Diagnosis not present

## 2023-06-22 DIAGNOSIS — Q165 Congenital malformation of inner ear: Secondary | ICD-10-CM | POA: Diagnosis not present

## 2023-06-30 DIAGNOSIS — R6889 Other general symptoms and signs: Secondary | ICD-10-CM | POA: Diagnosis not present

## 2023-06-30 DIAGNOSIS — H04123 Dry eye syndrome of bilateral lacrimal glands: Secondary | ICD-10-CM | POA: Diagnosis not present

## 2023-06-30 DIAGNOSIS — H47293 Other optic atrophy, bilateral: Secondary | ICD-10-CM | POA: Diagnosis not present

## 2023-06-30 DIAGNOSIS — H53453 Other localized visual field defect, bilateral: Secondary | ICD-10-CM | POA: Diagnosis not present

## 2023-06-30 DIAGNOSIS — G935 Compression of brain: Secondary | ICD-10-CM | POA: Diagnosis not present

## 2023-07-16 DIAGNOSIS — M797 Fibromyalgia: Secondary | ICD-10-CM | POA: Diagnosis not present

## 2023-07-16 DIAGNOSIS — R109 Unspecified abdominal pain: Secondary | ICD-10-CM | POA: Diagnosis not present

## 2023-07-16 DIAGNOSIS — Z6841 Body Mass Index (BMI) 40.0 and over, adult: Secondary | ICD-10-CM | POA: Diagnosis not present

## 2023-07-16 DIAGNOSIS — G935 Compression of brain: Secondary | ICD-10-CM | POA: Diagnosis not present

## 2023-07-23 DIAGNOSIS — M549 Dorsalgia, unspecified: Secondary | ICD-10-CM | POA: Diagnosis not present

## 2023-08-12 DIAGNOSIS — Z7902 Long term (current) use of antithrombotics/antiplatelets: Secondary | ICD-10-CM | POA: Diagnosis not present

## 2023-08-12 DIAGNOSIS — I10 Essential (primary) hypertension: Secondary | ICD-10-CM | POA: Diagnosis not present

## 2023-08-12 DIAGNOSIS — Z818 Family history of other mental and behavioral disorders: Secondary | ICD-10-CM | POA: Diagnosis not present

## 2023-08-12 DIAGNOSIS — G43909 Migraine, unspecified, not intractable, without status migrainosus: Secondary | ICD-10-CM | POA: Diagnosis not present

## 2023-08-12 DIAGNOSIS — F419 Anxiety disorder, unspecified: Secondary | ICD-10-CM | POA: Diagnosis not present

## 2023-08-12 DIAGNOSIS — Z87892 Personal history of anaphylaxis: Secondary | ICD-10-CM | POA: Diagnosis not present

## 2023-08-12 DIAGNOSIS — Z8249 Family history of ischemic heart disease and other diseases of the circulatory system: Secondary | ICD-10-CM | POA: Diagnosis not present

## 2023-08-12 DIAGNOSIS — Z833 Family history of diabetes mellitus: Secondary | ICD-10-CM | POA: Diagnosis not present

## 2023-08-12 DIAGNOSIS — G629 Polyneuropathy, unspecified: Secondary | ICD-10-CM | POA: Diagnosis not present

## 2023-08-12 DIAGNOSIS — Z87891 Personal history of nicotine dependence: Secondary | ICD-10-CM | POA: Diagnosis not present

## 2023-08-12 DIAGNOSIS — I2510 Atherosclerotic heart disease of native coronary artery without angina pectoris: Secondary | ICD-10-CM | POA: Diagnosis not present

## 2023-08-12 DIAGNOSIS — M1990 Unspecified osteoarthritis, unspecified site: Secondary | ICD-10-CM | POA: Diagnosis not present

## 2023-08-16 DIAGNOSIS — M533 Sacrococcygeal disorders, not elsewhere classified: Secondary | ICD-10-CM | POA: Diagnosis not present

## 2023-08-16 DIAGNOSIS — M797 Fibromyalgia: Secondary | ICD-10-CM | POA: Diagnosis not present

## 2023-08-16 DIAGNOSIS — M5416 Radiculopathy, lumbar region: Secondary | ICD-10-CM | POA: Diagnosis not present

## 2023-08-16 DIAGNOSIS — M47816 Spondylosis without myelopathy or radiculopathy, lumbar region: Secondary | ICD-10-CM | POA: Diagnosis not present

## 2023-08-24 DIAGNOSIS — M545 Low back pain, unspecified: Secondary | ICD-10-CM | POA: Diagnosis not present

## 2023-08-24 DIAGNOSIS — K760 Fatty (change of) liver, not elsewhere classified: Secondary | ICD-10-CM | POA: Diagnosis not present

## 2023-08-24 DIAGNOSIS — M79605 Pain in left leg: Secondary | ICD-10-CM | POA: Diagnosis not present

## 2023-08-24 DIAGNOSIS — M797 Fibromyalgia: Secondary | ICD-10-CM | POA: Diagnosis not present

## 2023-08-24 DIAGNOSIS — G935 Compression of brain: Secondary | ICD-10-CM | POA: Diagnosis not present

## 2023-08-24 DIAGNOSIS — Z113 Encounter for screening for infections with a predominantly sexual mode of transmission: Secondary | ICD-10-CM | POA: Diagnosis not present

## 2023-08-24 DIAGNOSIS — R8290 Unspecified abnormal findings in urine: Secondary | ICD-10-CM | POA: Diagnosis not present

## 2023-08-24 DIAGNOSIS — Z6841 Body Mass Index (BMI) 40.0 and over, adult: Secondary | ICD-10-CM | POA: Diagnosis not present

## 2023-08-24 DIAGNOSIS — R7309 Other abnormal glucose: Secondary | ICD-10-CM | POA: Diagnosis not present

## 2023-08-24 DIAGNOSIS — R519 Headache, unspecified: Secondary | ICD-10-CM | POA: Diagnosis not present

## 2023-08-24 DIAGNOSIS — Z1159 Encounter for screening for other viral diseases: Secondary | ICD-10-CM | POA: Diagnosis not present

## 2023-08-24 DIAGNOSIS — Z114 Encounter for screening for human immunodeficiency virus [HIV]: Secondary | ICD-10-CM | POA: Diagnosis not present

## 2023-08-24 DIAGNOSIS — G8929 Other chronic pain: Secondary | ICD-10-CM | POA: Diagnosis not present

## 2023-08-25 DIAGNOSIS — M533 Sacrococcygeal disorders, not elsewhere classified: Secondary | ICD-10-CM | POA: Diagnosis not present

## 2023-08-25 DIAGNOSIS — M797 Fibromyalgia: Secondary | ICD-10-CM | POA: Diagnosis not present

## 2023-08-25 DIAGNOSIS — M6281 Muscle weakness (generalized): Secondary | ICD-10-CM | POA: Diagnosis not present

## 2023-08-31 DIAGNOSIS — Z0000 Encounter for general adult medical examination without abnormal findings: Secondary | ICD-10-CM | POA: Diagnosis not present

## 2023-08-31 DIAGNOSIS — G894 Chronic pain syndrome: Secondary | ICD-10-CM | POA: Diagnosis not present

## 2023-08-31 DIAGNOSIS — R262 Difficulty in walking, not elsewhere classified: Secondary | ICD-10-CM | POA: Diagnosis not present

## 2023-08-31 DIAGNOSIS — G935 Compression of brain: Secondary | ICD-10-CM | POA: Diagnosis not present

## 2023-08-31 DIAGNOSIS — M797 Fibromyalgia: Secondary | ICD-10-CM | POA: Diagnosis not present

## 2023-08-31 DIAGNOSIS — R7309 Other abnormal glucose: Secondary | ICD-10-CM | POA: Diagnosis not present

## 2023-08-31 DIAGNOSIS — Z1339 Encounter for screening examination for other mental health and behavioral disorders: Secondary | ICD-10-CM | POA: Diagnosis not present

## 2023-08-31 DIAGNOSIS — Z1331 Encounter for screening for depression: Secondary | ICD-10-CM | POA: Diagnosis not present

## 2023-08-31 DIAGNOSIS — Z6841 Body Mass Index (BMI) 40.0 and over, adult: Secondary | ICD-10-CM | POA: Diagnosis not present

## 2023-08-31 DIAGNOSIS — Z2821 Immunization not carried out because of patient refusal: Secondary | ICD-10-CM | POA: Diagnosis not present

## 2023-08-31 DIAGNOSIS — R7989 Other specified abnormal findings of blood chemistry: Secondary | ICD-10-CM | POA: Diagnosis not present

## 2023-08-31 DIAGNOSIS — F411 Generalized anxiety disorder: Secondary | ICD-10-CM | POA: Diagnosis not present

## 2023-09-01 DIAGNOSIS — M6281 Muscle weakness (generalized): Secondary | ICD-10-CM | POA: Diagnosis not present

## 2023-09-01 DIAGNOSIS — M533 Sacrococcygeal disorders, not elsewhere classified: Secondary | ICD-10-CM | POA: Diagnosis not present

## 2023-09-01 DIAGNOSIS — M797 Fibromyalgia: Secondary | ICD-10-CM | POA: Diagnosis not present

## 2023-09-06 DIAGNOSIS — M533 Sacrococcygeal disorders, not elsewhere classified: Secondary | ICD-10-CM | POA: Diagnosis not present

## 2023-09-06 DIAGNOSIS — M797 Fibromyalgia: Secondary | ICD-10-CM | POA: Diagnosis not present

## 2023-09-06 DIAGNOSIS — M6281 Muscle weakness (generalized): Secondary | ICD-10-CM | POA: Diagnosis not present

## 2023-09-15 DIAGNOSIS — M533 Sacrococcygeal disorders, not elsewhere classified: Secondary | ICD-10-CM | POA: Diagnosis not present

## 2023-09-15 DIAGNOSIS — M797 Fibromyalgia: Secondary | ICD-10-CM | POA: Diagnosis not present

## 2023-09-15 DIAGNOSIS — M6281 Muscle weakness (generalized): Secondary | ICD-10-CM | POA: Diagnosis not present

## 2023-09-22 DIAGNOSIS — M6281 Muscle weakness (generalized): Secondary | ICD-10-CM | POA: Diagnosis not present

## 2023-09-22 DIAGNOSIS — M797 Fibromyalgia: Secondary | ICD-10-CM | POA: Diagnosis not present

## 2023-09-22 DIAGNOSIS — M533 Sacrococcygeal disorders, not elsewhere classified: Secondary | ICD-10-CM | POA: Diagnosis not present

## 2023-09-27 DIAGNOSIS — M47816 Spondylosis without myelopathy or radiculopathy, lumbar region: Secondary | ICD-10-CM | POA: Diagnosis not present

## 2023-09-27 DIAGNOSIS — M533 Sacrococcygeal disorders, not elsewhere classified: Secondary | ICD-10-CM | POA: Diagnosis not present

## 2023-09-27 DIAGNOSIS — M797 Fibromyalgia: Secondary | ICD-10-CM | POA: Diagnosis not present

## 2023-10-06 DIAGNOSIS — E66813 Obesity, class 3: Secondary | ICD-10-CM | POA: Diagnosis not present

## 2023-10-06 DIAGNOSIS — M6281 Muscle weakness (generalized): Secondary | ICD-10-CM | POA: Diagnosis not present

## 2023-10-06 DIAGNOSIS — M533 Sacrococcygeal disorders, not elsewhere classified: Secondary | ICD-10-CM | POA: Diagnosis not present

## 2023-10-06 DIAGNOSIS — M797 Fibromyalgia: Secondary | ICD-10-CM | POA: Diagnosis not present

## 2023-10-06 DIAGNOSIS — Z6841 Body Mass Index (BMI) 40.0 and over, adult: Secondary | ICD-10-CM | POA: Diagnosis not present

## 2023-10-06 DIAGNOSIS — Z713 Dietary counseling and surveillance: Secondary | ICD-10-CM | POA: Diagnosis not present

## 2023-10-19 DIAGNOSIS — M533 Sacrococcygeal disorders, not elsewhere classified: Secondary | ICD-10-CM | POA: Diagnosis not present

## 2023-10-19 DIAGNOSIS — M797 Fibromyalgia: Secondary | ICD-10-CM | POA: Diagnosis not present

## 2023-10-19 DIAGNOSIS — M6281 Muscle weakness (generalized): Secondary | ICD-10-CM | POA: Diagnosis not present

## 2023-12-29 ENCOUNTER — Other Ambulatory Visit: Payer: Self-pay | Admitting: Neurology

## 2023-12-29 DIAGNOSIS — H539 Unspecified visual disturbance: Secondary | ICD-10-CM

## 2023-12-29 DIAGNOSIS — R519 Headache, unspecified: Secondary | ICD-10-CM

## 2024-01-05 ENCOUNTER — Other Ambulatory Visit: Payer: Self-pay | Admitting: Internal Medicine

## 2024-01-05 DIAGNOSIS — K7689 Other specified diseases of liver: Secondary | ICD-10-CM

## 2024-01-05 DIAGNOSIS — K769 Liver disease, unspecified: Secondary | ICD-10-CM

## 2024-01-07 ENCOUNTER — Encounter: Payer: Self-pay | Admitting: Neurology

## 2024-01-07 ENCOUNTER — Ambulatory Visit
Admission: RE | Admit: 2024-01-07 | Discharge: 2024-01-07 | Disposition: A | Discharge status: Home / Self Care | Source: Ambulatory Visit | Attending: Internal Medicine | Admitting: Internal Medicine

## 2024-01-07 DIAGNOSIS — K7689 Other specified diseases of liver: Secondary | ICD-10-CM | POA: Insufficient documentation

## 2024-01-07 DIAGNOSIS — K769 Liver disease, unspecified: Secondary | ICD-10-CM | POA: Insufficient documentation

## 2024-01-07 MED ORDER — GADOBUTROL 1 MMOL/ML IV SOLN
10.0000 mL | Freq: Once | INTRAVENOUS | Status: AC | PRN
  Administered 2024-01-07: 10 mL via INTRAVENOUS

## 2024-01-09 ENCOUNTER — Other Ambulatory Visit

## 2024-01-22 IMAGING — MG MM DIGITAL SCREENING BILAT W/ TOMO AND CAD
8 series · 8 of 24 positions shown · non-contrast
Comparison: Previous exam(s).

CLINICAL DATA: Screening.

EXAM:
DIGITAL SCREENING BILATERAL MAMMOGRAM WITH TOMOSYNTHESIS AND CAD
TECHNIQUE: Bilateral screening digital craniocaudal and mediolateral oblique
mammograms were obtained. Bilateral screening digital breast
tomosynthesis was performed. The images were evaluated with
computer-aided detection.

[R MLO synth-2D]
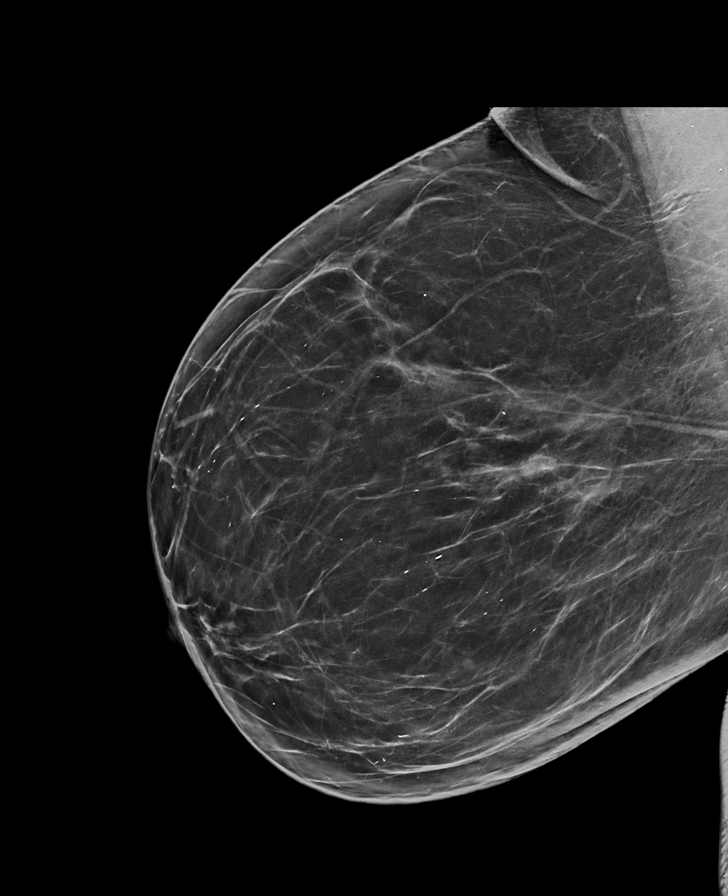

[R CC synth-2D]
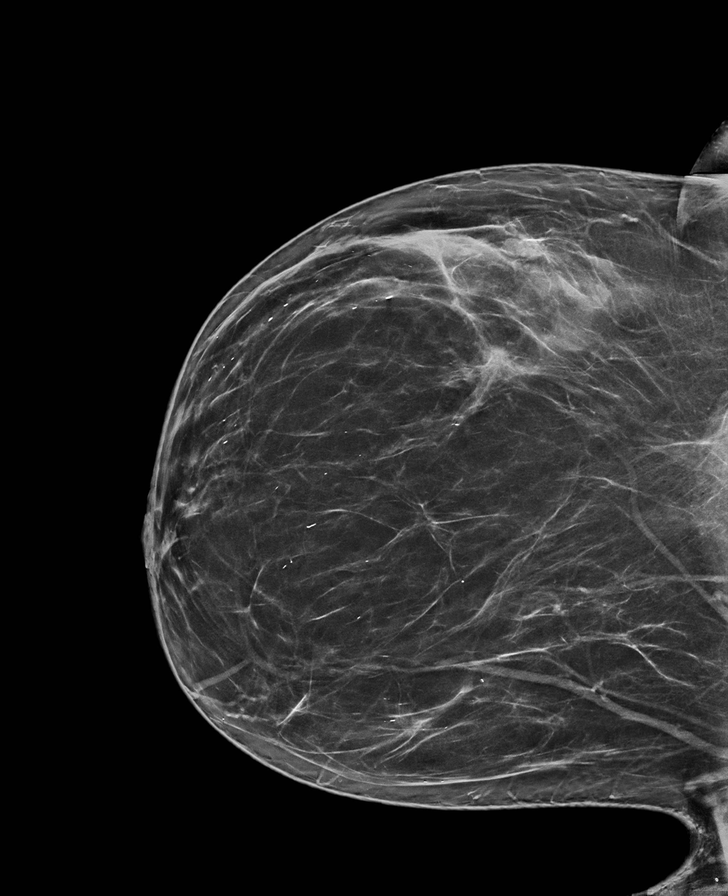

[L CC synth-2D]
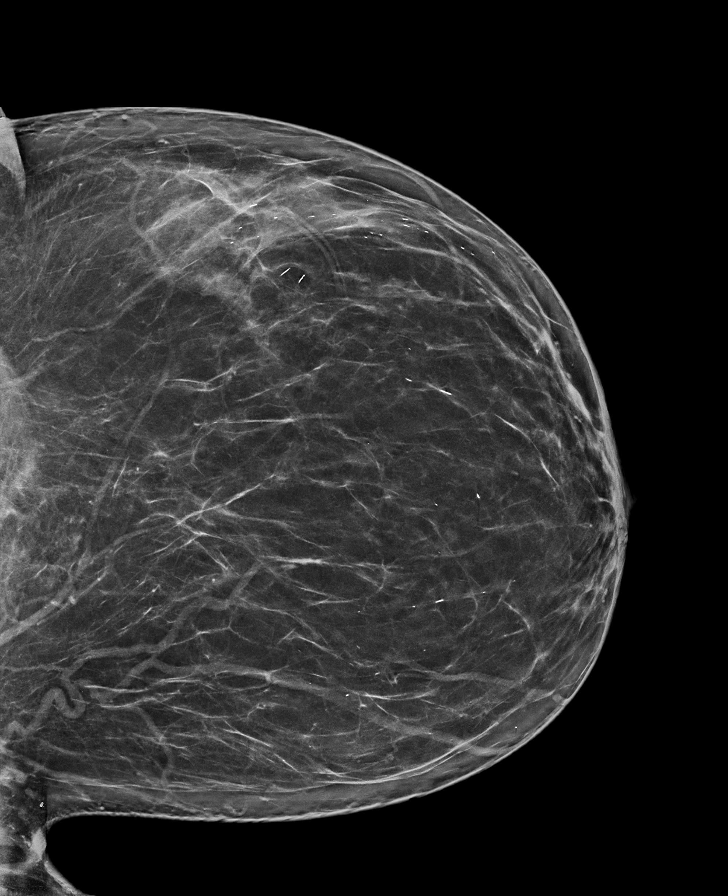

[L MLO synth-2D]
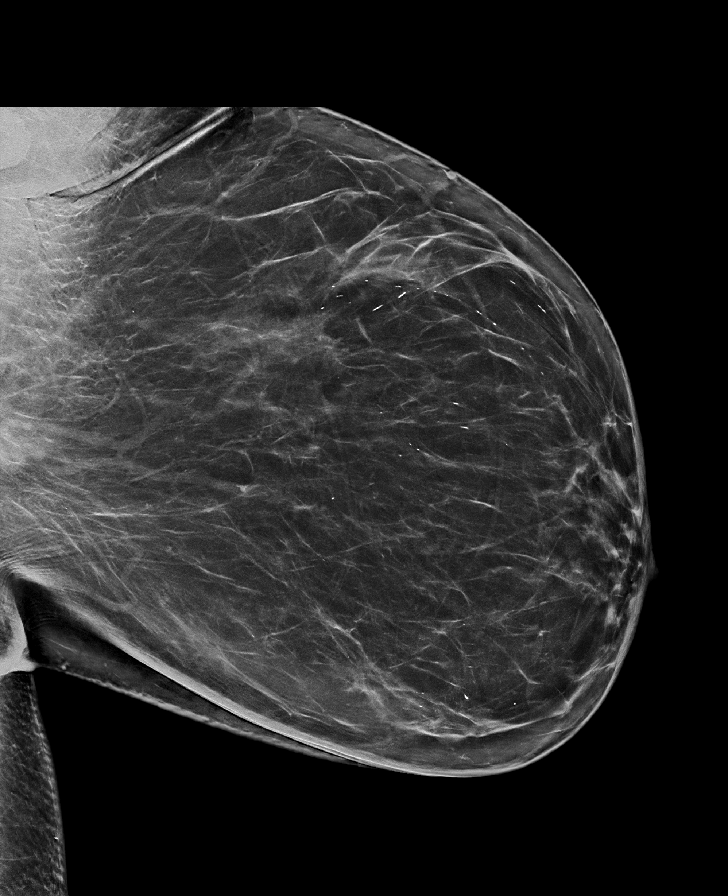

[R MLO tomo · tomo slice 41/81.0]
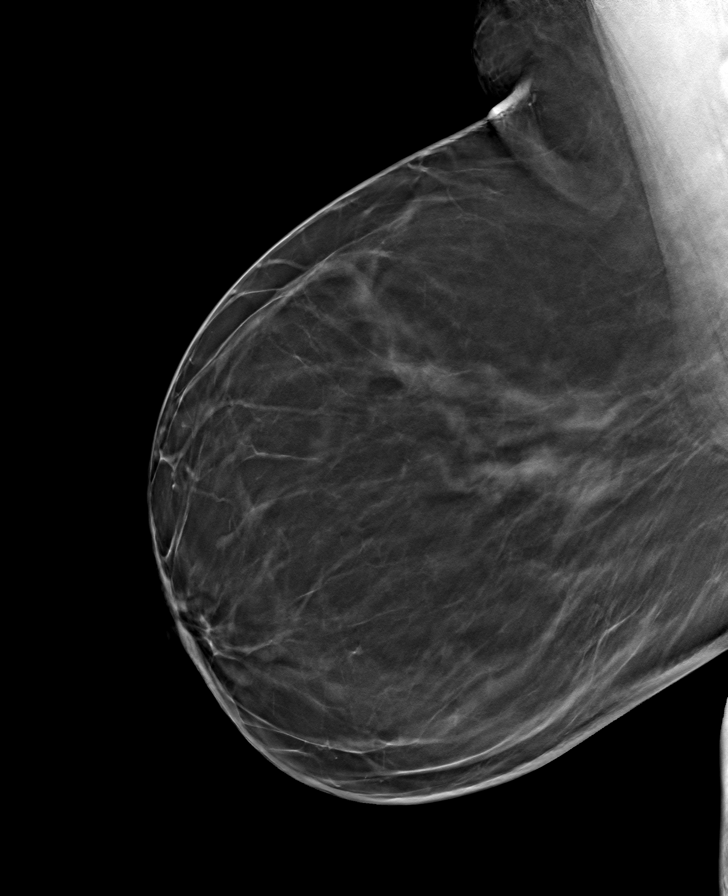

[R CC tomo · tomo slice 39/77.0]
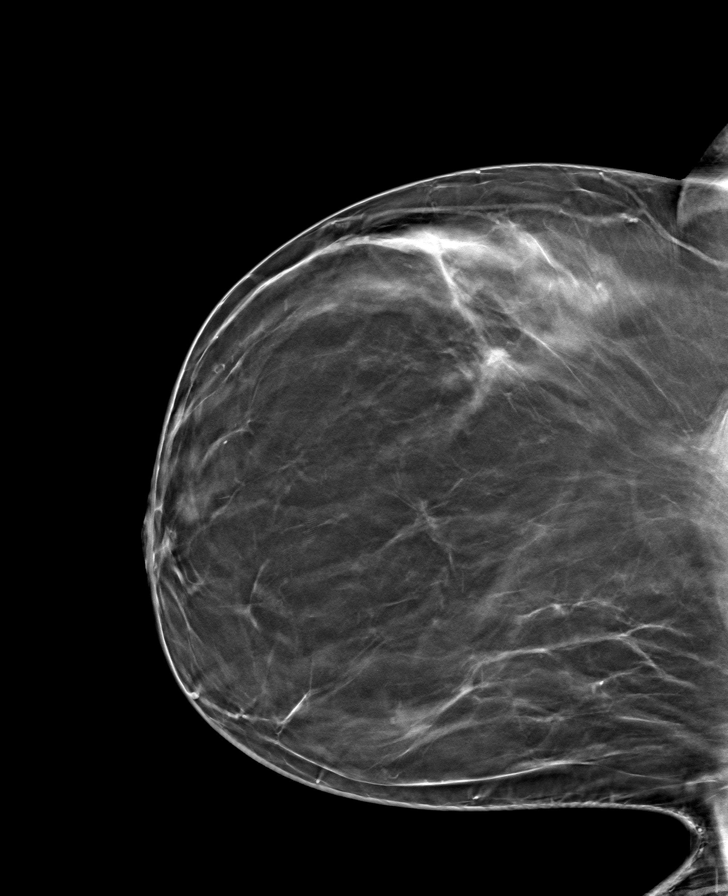

[L CC tomo · tomo slice 39/78.0]
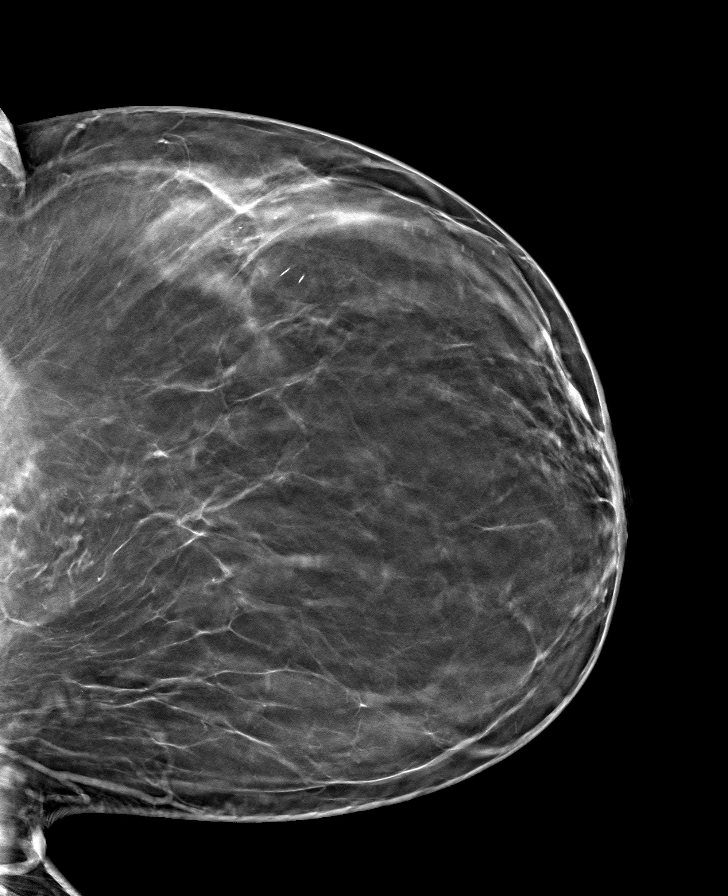

[L MLO tomo · tomo slice 45/90.0]
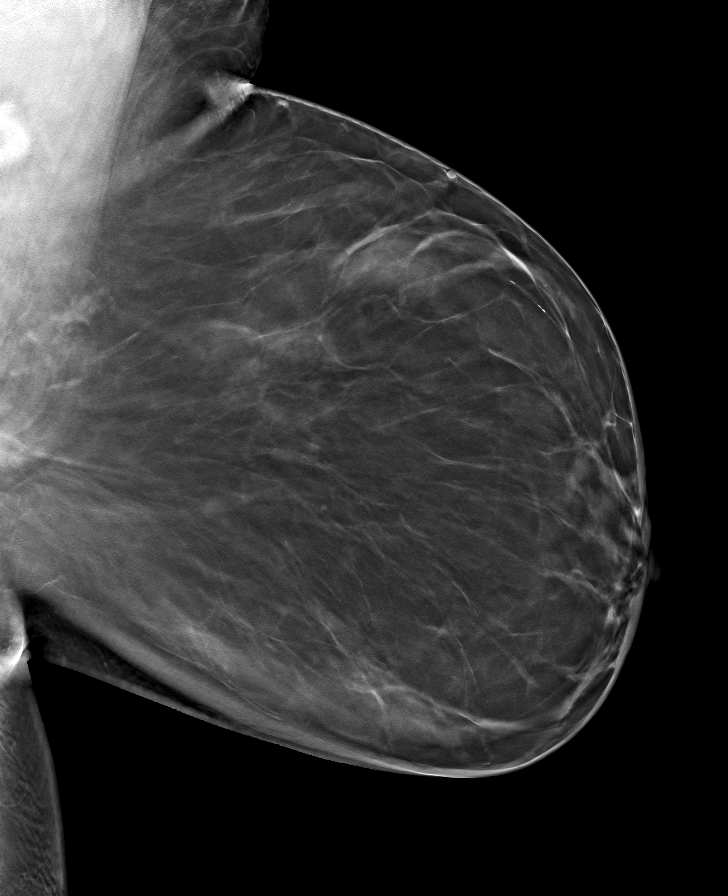

[8 of 24 positions shown; findings below may reference images not displayed]

ACR Breast Density Category b: There are scattered areas of
fibroglandular density.
FINDINGS: In the right breast, a possible asymmetry warrants further
evaluation. In the left breast, no findings suspicious for
malignancy.
IMPRESSION: Further evaluation is suggested for possible asymmetry in the right
breast.

RECOMMENDATION:
Diagnostic mammogram and possibly ultrasound of the right breast.
(Code:BU-[id number])

The patient will be contacted regarding the findings, and additional
imaging will be scheduled.

BI-RADS CATEGORY  0: Incomplete. Need additional imaging evaluation
and/or prior mammograms for comparison.

## 2024-02-09 ENCOUNTER — Ambulatory Visit
Admission: RE | Admit: 2024-02-09 | Discharge: 2024-02-09 | Discharge status: Home / Self Care | Source: Ambulatory Visit | Attending: Neurology | Admitting: Neurology

## 2024-02-09 DIAGNOSIS — R519 Headache, unspecified: Secondary | ICD-10-CM

## 2024-02-09 DIAGNOSIS — H539 Unspecified visual disturbance: Secondary | ICD-10-CM

## 2024-03-03 ENCOUNTER — Other Ambulatory Visit: Payer: Self-pay | Admitting: Orthopedic Surgery

## 2024-03-03 DIAGNOSIS — M4802 Spinal stenosis, cervical region: Secondary | ICD-10-CM

## 2024-03-06 ENCOUNTER — Ambulatory Visit
Admission: RE | Admit: 2024-03-06 | Discharge: 2024-03-06 | Disposition: A | Discharge status: Home / Self Care | Source: Ambulatory Visit | Attending: Orthopedic Surgery | Admitting: Orthopedic Surgery

## 2024-03-06 DIAGNOSIS — M4802 Spinal stenosis, cervical region: Secondary | ICD-10-CM | POA: Diagnosis present

## 2024-03-18 LAB — COLOGUARD: COLOGUARD: NEGATIVE

## 2024-05-01 ENCOUNTER — Other Ambulatory Visit: Payer: Self-pay | Admitting: Internal Medicine

## 2024-05-01 DIAGNOSIS — Z1231 Encounter for screening mammogram for malignant neoplasm of breast: Secondary | ICD-10-CM

## 2024-05-22 ENCOUNTER — Other Ambulatory Visit: Payer: Self-pay | Admitting: Family Medicine

## 2024-05-22 DIAGNOSIS — M5412 Radiculopathy, cervical region: Secondary | ICD-10-CM

## 2024-05-25 ENCOUNTER — Ambulatory Visit
Admission: RE | Admit: 2024-05-25 | Discharge: 2024-05-25 | Disposition: A | Discharge status: Home / Self Care | Source: Ambulatory Visit | Attending: Internal Medicine | Admitting: Internal Medicine

## 2024-05-25 DIAGNOSIS — Z1231 Encounter for screening mammogram for malignant neoplasm of breast: Secondary | ICD-10-CM | POA: Insufficient documentation

## 2024-06-01 NOTE — Discharge Instructions (Signed)

## 2024-06-02 ENCOUNTER — Ambulatory Visit
Admission: RE | Admit: 2024-06-02 | Discharge: 2024-06-02 | Disposition: A | Discharge status: Home / Self Care | Source: Ambulatory Visit | Attending: Family Medicine | Admitting: Family Medicine

## 2024-06-02 DIAGNOSIS — M5412 Radiculopathy, cervical region: Secondary | ICD-10-CM

## 2024-06-02 MED ORDER — TRIAMCINOLONE ACETONIDE 40 MG/ML IJ SUSP (RADIOLOGY)
60.0000 mg | Freq: Once | INTRAMUSCULAR | Status: AC
  Administered 2024-06-02: 60 mg via EPIDURAL

## 2024-06-02 MED ORDER — IOPAMIDOL (ISOVUE-M 300) INJECTION 61%
1.0000 mL | Freq: Once | INTRAMUSCULAR | Status: AC | PRN
  Administered 2024-06-02: 1 mL via EPIDURAL

## 2024-08-17 ENCOUNTER — Other Ambulatory Visit: Payer: Self-pay

## 2024-08-17 ENCOUNTER — Encounter: Payer: Self-pay | Admitting: *Deleted

## 2024-08-17 DIAGNOSIS — N839 Noninflammatory disorder of ovary, fallopian tube and broad ligament, unspecified: Secondary | ICD-10-CM | POA: Diagnosis not present

## 2024-08-17 DIAGNOSIS — K5900 Constipation, unspecified: Secondary | ICD-10-CM | POA: Insufficient documentation

## 2024-08-17 DIAGNOSIS — R109 Unspecified abdominal pain: Secondary | ICD-10-CM | POA: Diagnosis present

## 2024-08-17 LAB — COMPREHENSIVE METABOLIC PANEL WITH GFR
ALT: 19 U/L (ref 0–44)
AST: 16 U/L (ref 15–41)
Albumin: 3.9 g/dL (ref 3.5–5.0)
Alkaline Phosphatase: 39 U/L (ref 38–126)
Anion gap: 13 (ref 5–15)
BUN: 13 mg/dL (ref 6–20)
CO2: 25 mmol/L (ref 22–32)
Calcium: 8.8 mg/dL — ABNORMAL LOW (ref 8.9–10.3)
Chloride: 103 mmol/L (ref 98–111)
Creatinine, Ser: 0.68 mg/dL (ref 0.44–1.00)
GFR, Estimated: >60 mL/min (ref >60)
Glucose, Bld: 118 mg/dL — ABNORMAL HIGH (ref 70–99)
Potassium: 3.5 mmol/L (ref 3.5–5.1)
Sodium: 141 mmol/L (ref 135–145)
Total Bilirubin: 0.9 mg/dL (ref 0.0–1.2)
Total Protein: 7.7 g/dL (ref 6.5–8.1)

## 2024-08-17 LAB — CBC
HCT: 38.2 % (ref 36.0–46.0)
Hemoglobin: 12.9 g/dL (ref 12.0–15.0)
MCH: 30.9 pg (ref 26.0–34.0)
MCHC: 33.8 g/dL (ref 30.0–36.0)
MCV: 91.6 fL (ref 80.0–100.0)
Platelets: 343 K/uL (ref 150–400)
RBC: 4.17 MIL/uL (ref 3.87–5.11)
RDW: 13.8 % (ref 11.5–15.5)
WBC: 8.6 K/uL (ref 4.0–10.5)
nRBC: 0 % (ref 0.0–0.2)

## 2024-08-17 LAB — URINALYSIS, ROUTINE W REFLEX MICROSCOPIC
Bacteria, UA: NONE SEEN
Bilirubin Urine: NEGATIVE
Glucose, UA: NEGATIVE mg/dL
Ketones, ur: NEGATIVE mg/dL
Nitrite: NEGATIVE
Protein, ur: NEGATIVE mg/dL
Specific Gravity, Urine: 1.014 (ref 1.005–1.030)
pH: 6 (ref 5.0–8.0)

## 2024-08-17 LAB — LIPASE, BLOOD: Lipase: 35 U/L (ref 11–51)

## 2024-08-17 NOTE — ED Triage Notes (Signed)
 Pt to triage via wheelchair.  Pt has right side pain and right side back pain.  No n/v/d  denies urinary sx.  Pt alert.

## 2024-08-18 ENCOUNTER — Emergency Department

## 2024-08-18 ENCOUNTER — Emergency Department
Admission: EM | Admit: 2024-08-18 | Discharge: 2024-08-18 | Disposition: A | Discharge status: Home / Self Care | Attending: Emergency Medicine | Admitting: Emergency Medicine

## 2024-08-18 DIAGNOSIS — N838 Other noninflammatory disorders of ovary, fallopian tube and broad ligament: Secondary | ICD-10-CM

## 2024-08-18 DIAGNOSIS — R109 Unspecified abdominal pain: Secondary | ICD-10-CM

## 2024-08-18 DIAGNOSIS — K5900 Constipation, unspecified: Secondary | ICD-10-CM

## 2024-08-18 MED ORDER — KETOROLAC TROMETHAMINE 15 MG/ML IJ SOLN
15.0000 mg | Freq: Once | INTRAMUSCULAR | Status: AC
  Administered 2024-08-18: 15 mg via INTRAVENOUS
  Filled 2024-08-18: qty 1

## 2024-08-18 MED ORDER — MORPHINE SULFATE (PF) 4 MG/ML IV SOLN
4.0000 mg | INTRAVENOUS | Status: DC | PRN
  Administered 2024-08-18: 4 mg via INTRAVENOUS
  Filled 2024-08-18: qty 1

## 2024-08-18 MED ORDER — SODIUM CHLORIDE 0.9 % IV BOLUS
500.0000 mL | Freq: Once | INTRAVENOUS | Status: AC
  Administered 2024-08-18: 500 mL via INTRAVENOUS

## 2024-08-18 MED ORDER — ONDANSETRON HCL 4 MG/2ML IJ SOLN
4.0000 mg | Freq: Once | INTRAMUSCULAR | Status: AC
  Administered 2024-08-18: 4 mg via INTRAVENOUS
  Filled 2024-08-18: qty 2

## 2024-08-18 MED ORDER — IOHEXOL 350 MG/ML SOLN
125.0000 mL | Freq: Once | INTRAVENOUS | Status: AC | PRN
  Administered 2024-08-18: 125 mL via INTRAVENOUS

## 2024-08-18 NOTE — ED Provider Notes (Signed)
 [facility] Provider Note    Event Date/Time   First MD Initiated Contact with Patient 08/18/24 [phone]     (approximate)   History   Abdominal Pain   HPI  Dominique Estes is a 53 y.o. female   Past medical history of remote kidney stone when she was a teenager who presents to Emergency Department with right-sided flank and right upper quadrant pain.  Started tonight after dinner.  No associated nausea vomiting or diarrhea.  No urinary symptoms.  Constant pain.  No skin rash.  Denies chest pain or shortness of breath.  No other acute medical complaints.  Independent Historian contributed to assessment above: Daughters at bedside corroborate information above  External Medical Documents Reviewed: Outpatient neurology notes      Physical Exam   Triage Vital Signs: ED Triage Vitals  Encounter Vitals Group     BP 08/17/24 2233 (!) 146/71     [name] Systolic BP Percentile --      [name] Diastolic BP Percentile --      Boys Systolic BP Percentile --      Boys Diastolic BP Percentile --      Pulse Rate 08/17/24 2233 100     Resp 08/17/24 2233 18     Temp 08/17/24 2233 (!) 100.4 F (38 C)     Temp Source 08/17/24 2233 Oral     SpO2 08/17/24 2233 100 %     Weight 08/17/24 2231 255 lb (115.7 kg)     Height 08/17/24 2231 5' 5 (1.651 m)     Head Circumference --      Peak Flow --      Pain Score 08/17/24 2231 10     Pain Loc --      Pain Education --      Exclude from Growth Chart --     Most recent vital signs: Vitals:   08/18/24 0500 08/18/24 0530  BP: 123/72 112/69  Pulse: 72 79  Resp:  16  Temp:  98 F (36.7 C)  SpO2: 100% 97%    General: Awake, no distress.  CV:  Good peripheral perfusion.  Resp:  Normal effort.  Abd:  No distention.  Other:  Tenderness to palpation in the right upper quadrant without rigidity or guarding, nonperitoneal exam.  Right CVA tenderness as well.  No skin rash.  Breathing comfortably on room air, borderline  fever 100.4 in triage.  Otherwise hemodynamics appropriate and reassuring.  Nontoxic overall appearance   ED Results / Procedures / Treatments   Labs (all labs ordered are listed, but only abnormal results are displayed) Labs Reviewed  COMPREHENSIVE METABOLIC PANEL WITH GFR - Abnormal; Notable for the following components:      Result Value   Glucose, Bld 118 (*)    Calcium 8.8 (*)    All other components within normal limits  URINALYSIS, ROUTINE W REFLEX MICROSCOPIC - Abnormal; Notable for the following components:   Color, Urine STRAW (*)    APPearance HAZY (*)    Hgb urine dipstick SMALL (*)    Leukocytes,Ua TRACE (*)    All other components within normal limits  LIPASE, BLOOD  CBC     I ordered and reviewed the above labs they are notable for LFTs normal, no leukocytosis   RADIOLOGY I independently reviewed and interpreted CT abdomen pelvis see no obvious obstructive or inflammatory changes I also reviewed radiologist's formal read.   PROCEDURES:  Critical Care performed: No  Procedures  MEDICATIONS ORDERED IN ED: Medications  morphine  (PF) 4 MG/ML injection 4 mg (4 mg Intravenous Given 08/18/24 0332)  ketorolac  (TORADOL ) 15 MG/ML injection 15 mg (15 mg Intravenous Given 08/18/24 0333)  ondansetron  (ZOFRAN ) injection 4 mg (4 mg Intravenous Given 08/18/24 0333)  sodium chloride  0.9 % bolus 500 mL (0 mLs Intravenous Stopped 08/18/24 0448)  iohexol  (OMNIPAQUE ) 350 MG/ML injection 125 mL (125 mLs Intravenous Contrast Given 08/18/24 0349)   IMPRESSION / MDM / ASSESSMENT AND PLAN / ED COURSE  I reviewed the triage vital signs and the nursing notes.                                Patient's presentation is most consistent with acute presentation with potential threat to life or bodily function.  Differential diagnosis includes, but is not limited to, renal colic/kidney stone/ureterolithiasis, urinary tract infection cholecystitis or biliary colic,  pancreatitis   The patient is on the cardiac monitor to evaluate for evidence of arrhythmia and/or significant heart rate changes.  MDM:    Right upper quadrant and right flank pain sudden onset this 53 year old with a history of remote kidney stones.  Wonder if she has a kidney stone, or biliary pathologies.  Will get a CT scan of the abdomen pelvis to better assess.  Pain control ordered.  CT abdomen pelvis with fecal stasis and a right ovarian complex cystic lesion.  Further evaluate lesion in the ovary with a trans vaginal ultrasound Doppler rule out torsion.  Patient reassessed in stable condition.  Disposition pending the results of her transvaginal ultrasound.      FINAL CLINICAL IMPRESSION(S) / ED DIAGNOSES   Final diagnoses:  Ovarian mass, right  Right sided abdominal pain  Constipation, unspecified constipation type     Rx / DC Orders   ED Discharge Orders     None        Note:  This document was prepared using Dragon voice recognition software and may include unintentional dictation errors.    Cyrena Mylar, MD 08/18/24 [phone]

## 2024-08-18 NOTE — Discharge Instructions (Addendum)
 Take MiraLAX  1 capful in the morning and 1 capful at nighttime to help with constipation as this may be contributing to your abdominal pain.  You also have a right sided ovarian cyst.  It is fortunate that you have a gynecology appointment next week.  Speak to them about this finding and any further testing or treatment options as needed.  Take acetaminophen  650 mg and ibuprofen  400 mg every 6 hours for pain.  Take with food.  Thank you for choosing us  for your health care today!  Please see your primary doctor this week for a follow up appointment.   If you have any new, worsening, or unexpected symptoms call your doctor right away or come back to the emergency department for reevaluation.  It was my pleasure to care for you today.   Ginnie EDISON Cyrena, MD

## 2024-09-18 HISTORY — PX: CARPAL TUNNEL RELEASE: SHX101

## 2024-10-17 NOTE — Progress Notes (Unsigned)
 "  Referring Physician:  Carlisle Benton CROME, FNP [address] [place],  [place] [address]  Primary Physician:  Sadie Manna, MD  History of Present Illness: 10/23/2024 Dominique Estes is here today with a chief complaint of progressive neck pain x 3 years.  She states her neck pain is constant, but is having worsening pain that radiates to her right arm.  She states it feels though it radiates into her shoulder and then  **Bilateral neck pain radiates  x 3 years. Neck pain is constant. to the right arm off and on and to the right hand, numbness in the right index finger off and on only goes into index fingers and goes numb      Chronic n right shoulder pain- seeing ortho   Has weakness in the arma nd will drop things.  Left side stays in between the shgoulder blades.   EMG 03/01/2024  R CTR in 2004, left one 2 weeks ago    Low back feels like it is pinching here, bilateral low back radiating to left leg to her foot and to ehr left hip and numbness in feet   At times will feel numb in the right arm     Duration: *** Location: *** Quality: *** Severity: ***  Precipitating: aggravated by *** Modifying factors: made better by *** Weakness: none Timing: *** Bowel/Bladder Dysfunction: none  Conservative measures:  Physical therapy: Has not participated in. 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   Past Surgery: no spine surgeries  Dominique Estes has ***no symptoms of cervical myelopathy.  The symptoms are causing a significant impact on the patient's life.   Review of Systems:  A 10 point review of  systems is negative, except for the pertinent positives and negatives detailed in the HPI.  Past Medical History: Past Medical History:  Diagnosis Date   Carpal tunnel syndrome of right wrist 2004   Chiari malformation type I (HCC)    History of kidney stones    age15    Past Surgical History: Past Surgical History:  Procedure Laterality Date   : IR CERVICOCEREBRAL ARCH 4 VESSEL  11/2022   BRAIN SURGERY     CARPAL TUNNEL RELEASE Left 09/2024   carpel tunnel release Right 2004   COLPOSCOPY  03/23/2013   TOTAL LAPAROSCOPIC HYSTERECTOMY WITH SALPINGECTOMY Bilateral 06/25/2021   Procedure: TOTAL LAPAROSCOPIC HYSTERECTOMY WITH SALPINGECTOMY, CYSTOSCOPY;  Surgeon: Verdon Keen, MD;  Location: [facility] ORS;  Service: Gynecology;  Laterality: Bilateral;    Allergies: Allergies as of 10/23/2024 - Review Complete 10/23/2024  Allergen Reaction Noted   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   AJOVY 225 MG/1.5ML SOAJ INJECT 1.5 ML INTO THE SKIN ONCE EVERY MONTH   ascorbic acid (VITAMIN C) 1000 MG tablet Take 1,000 mg by mouth daily.   aspirin EC 81 MG tablet Take 81 mg by mouth daily.   atorvastatin (LIPITOR) 10 MG tablet Take 10  mg by mouth daily.   butalbital -acetaminophen -caffeine  (FIORICET) 50-325-40 MG tablet Take 1 tablet by mouth every 6 (six) hours as needed.   diazepam (VALIUM) 5 MG tablet Take 5 mg by mouth every 8 (eight) hours as needed.   gabapentin  (NEURONTIN ) 300 MG capsule Take 300 mg by mouth 3 (three) times daily.   HYDROcodone-acetaminophen  (NORCO/VICODIN) 5-325 MG tablet Take 1 tablet by mouth every 6 (six) hours as needed.   nortriptyline (PAMELOR) 10 MG capsule Take 10-20 mg by  mouth daily as needed.   nortriptyline (PAMELOR) 50 MG capsule Take 50 mg by mouth at bedtime.   pregabalin (LYRICA) 150 MG capsule Take 1 capsule by mouth 2 (two) times daily.   ticagrelor (BRILINTA) 90 MG TABS tablet Take 90 mg by mouth 2 (two) times daily.   tiZANidine (ZANAFLEX) 2 MG tablet Take 2 mg by mouth 2 (two) times daily as needed.   topiramate (TOPAMAX) 25 MG capsule Take 25 mg by mouth 2 (two) times daily.   traMADol  (ULTRAM ) 50 MG tablet Take 50 mg by mouth every 6 (six) hours as needed.   vitamin B-12 (CYANOCOBALAMIN) 500 MCG tablet Take 500 mcg by mouth daily.   Vitamin D, Ergocalciferol, (DRISDOL) 1.25 MG (50000 UNIT) CAPS capsule Take 50,000 Units by mouth every Monday. monday   [DISCONTINUED] docusate sodium  (COLACE) 100 MG capsule Take 1 capsule (100 mg total) by mouth 2 (two) times daily. To keep stools soft (Patient not taking: Reported on 09/23/2021)   [DISCONTINUED] gabapentin  (NEURONTIN ) 800 MG tablet Take 1 tablet (800 mg total) by mouth at bedtime for 14 days. Take nightly for 3 days, then up to 14 days as needed (Patient not taking: Reported on 09/23/2021)   [DISCONTINUED] meloxicam (MOBIC) 7.5 MG tablet Take 7.5 mg by mouth daily. (Patient not taking: Reported on 04/23/2023)   [DISCONTINUED] nortriptyline (PAMELOR) 25 MG capsule Take 25 mg by mouth at bedtime.   [DISCONTINUED] rizatriptan (MAXALT) 10 MG tablet Take 10 mg by mouth as needed for migraine. May repeat in 2 hours if needed (Patient not taking: Reported on 04/23/2023)   No facility-administered encounter medications on file as of 10/23/2024.    Social History: Social History[1]  Family Medical History: Family History  Problem Relation Age of Onset   Diabetes Maternal Grandmother    Fibroids Mother    Hypertension Mother    Diabetes Brother    Anxiety disorder Daughter    Breast cancer Neg Hx     Physical Examination: @VITALWITHPAIN @  General: Patient is well developed, well nourished, calm,  collected, and in no apparent distress. Attention to examination is appropriate.  Psychiatric: Patient is non-anxious.  Head:  Pupils equal, round, and reactive to light.  ENT:  Oral mucosa appears well hydrated.  Neck:   Supple.  ***Full range of motion.  Respiratory: Patient is breathing without any difficulty.  Extremities: No edema.  Vascular: Palpable dorsal pedal pulses.  Skin:   On exposed skin, there are no abnormal skin lesions.  NEUROLOGICAL:     Awake, alert, oriented to person, place, and time.  Speech is clear and fluent. Fund of knowledge is appropriate.   Cranial Nerves: Pupils equal round and reactive to light.  Facial tone is symmetric.  Facial sensation is symmetric.  ROM of spine: ***full.  Palpation of spine: ***non tender.    +CTS o right6   Strength: Side Biceps Triceps Deltoid Interossei Grip Wrist Ext. Wrist Flex.  R 5 5 5 5 5 5 5   L  5 5 5 5 5 5 5    Side Iliopsoas Quads Hamstring PF DF EHL  R 5 5 5 5 5 5   L 5 5 5 5 5 5    Reflexes are ***2+ and symmetric at the biceps, triceps, brachioradialis, patella and achilles.   Hoffman's is absent.  Clonus is not present.  Toes are down-going.  Bilateral upper and lower extremity sensation is intact to light touch.    Gait is normal.   No difficulty with tandem gait.   No evidence of dysmetria noted.  Medical Decision Making  Imaging: EXAM: MRI CERVICAL SPINE WITHOUT CONTRAST   TECHNIQUE: Multiplanar, multisequence MR imaging of the cervical spine was performed. No intravenous contrast was administered.   COMPARISON:  Prior MRI from 09/24/2021   FINDINGS: Alignment: Straightening with slight reversal of the normal cervical lordosis. No significant listhesis.   Vertebrae: Vertebral body height maintained without acute or chronic fracture. Decreased T1 signal intensity noted within the visualized bone marrow, nonspecific, but most commonly related to anemia, smoking, obesity. No worrisome  osseous lesions or abnormal marrow edema.   Cord: Normal signal and morphology.  No syrinx.   Posterior Fossa, vertebral arteries, paraspinal tissues: Postoperative changes from prior suboccipital craniectomy for Chiari decompression noted at the skull base. Partially empty sella noted. Paraspinous soft tissues demonstrate no acute finding. Normal flow voids seen within the vertebral arteries bilaterally.   Disc levels:   C2-C3: Mild endplate spurring without significant disc bulge. Mild right-sided facet spurring. No spinal stenosis. Foramina remain patent.   C3-C4: Small central disc protrusion minimally indents the ventral thecal sac. Mild bilateral uncovertebral spurring. No significant spinal stenosis. Foramina remain patent.   C4-C5: Diffuse disc bulge with bilateral uncovertebral spurring. Superimposed tiny left paracentral disc protrusion minimally indents the ventral thecal sac. No significant spinal stenosis. Foramina remain patent.   C5-C6: Mild disc bulge with bilateral uncovertebral spurring. No significant spinal stenosis. Foramina remain patent.   C6-C7: Right paracentral disc protrusion indents the right ventral thecal sac, contacting and flattening the right ventral cord (series 9, image 31). No cord signal changes. No more than mild spinal stenosis. Foramina remain patent.   C7-T1: Negative interspace. Mild bilateral facet hypertrophy, worse on the right. No spinal stenosis. Foramina remain patent.   IMPRESSION: 1. Right paracentral disc protrusion at C6-7, contacting and flattening the right ventral cord with resultant mild spinal stenosis. No cord signal changes. 2. Additional mild noncompressive disc bulging at C3-4 through C5-6 without significant stenosis or neural impingement. 3. Postoperative changes from prior suboccipital craniectomy for Chiari decompression. No syrinx.  I have personally reviewed the images and agree with the above  interpretation.  Assessment and Plan: Dominique Estes is a pleasant 53 y.o. female with ***   PT EMF RUE, bracing Cervical flex/ex   Thank you for involving me in the care of this patient.   I spent a total of *** minutes in both face-to-face and non-face-to-face activities for this visit on the date of this encounter.   Lyle Decamp, PA-C Dept. of Neurosurgery     [1]  Social History Tobacco Use   Smoking status: Former    Current packs/day: 0.00    Average packs/day: 0.5 packs/day    Types: Cigarettes    Quit date: 09/30/2022    Years since quitting: 2.0   Smokeless tobacco: Never  Vaping Use   Vaping status: Never Used  Substance Use Topics   Alcohol use: Yes    Alcohol/week: 1.0 standard drink  of alcohol    Types: 1 Cans of beer per week    Comment: three timess a week   Drug use: Not Currently    Types: Marijuana    Comment: none since 1997   "

## 2024-10-23 ENCOUNTER — Ambulatory Visit (INDEPENDENT_AMBULATORY_CARE_PROVIDER_SITE_OTHER): Admitting: Physician Assistant

## 2024-10-23 ENCOUNTER — Ambulatory Visit

## 2024-10-23 ENCOUNTER — Encounter: Payer: Self-pay | Admitting: Physician Assistant

## 2024-10-23 VITALS — BP 156/93 | HR 78 | Ht 65.0 in | Wt 258.0 lb

## 2024-10-23 DIAGNOSIS — R200 Anesthesia of skin: Secondary | ICD-10-CM | POA: Diagnosis not present

## 2024-10-23 DIAGNOSIS — R202 Paresthesia of skin: Secondary | ICD-10-CM

## 2024-10-23 DIAGNOSIS — Z9889 Other specified postprocedural states: Secondary | ICD-10-CM | POA: Diagnosis not present

## 2024-10-23 DIAGNOSIS — M542 Cervicalgia: Secondary | ICD-10-CM | POA: Diagnosis not present

## 2024-10-23 DIAGNOSIS — M50223 Other cervical disc displacement at C6-C7 level: Secondary | ICD-10-CM | POA: Diagnosis not present

## 2024-10-23 DIAGNOSIS — M5412 Radiculopathy, cervical region: Secondary | ICD-10-CM | POA: Diagnosis not present
